# Patient Record
Sex: Female | Born: 1960
Health system: Southern US, Community
[De-identification: ages and names within clinical notes are randomized; demographics above are authoritative.]

## PROBLEM LIST (undated history)

## (undated) DIAGNOSIS — E079 Disorder of thyroid, unspecified: Secondary | ICD-10-CM

## (undated) DIAGNOSIS — I10 Essential (primary) hypertension: Secondary | ICD-10-CM

## (undated) DIAGNOSIS — T7840XA Allergy, unspecified, initial encounter: Secondary | ICD-10-CM

## (undated) DIAGNOSIS — N979 Female infertility, unspecified: Secondary | ICD-10-CM

## (undated) HISTORY — DX: Disorder of thyroid, unspecified: E07.9

## (undated) HISTORY — DX: Allergy, unspecified, initial encounter: T78.40XA

## (undated) HISTORY — DX: Female infertility, unspecified: N97.9

## (undated) HISTORY — DX: Essential (primary) hypertension: I10

## (undated) HISTORY — PX: TUBAL LIGATION: SHX77

---

## 2000-08-17 ENCOUNTER — Other Ambulatory Visit: Admission: RE | Admit: 2000-08-17 | Discharge: 2000-08-17 | Payer: Self-pay | Admitting: Obstetrics and Gynecology

## 2003-07-13 ENCOUNTER — Inpatient Hospital Stay (HOSPITAL_COMMUNITY): Admission: AD | Admit: 2003-07-13 | Discharge: 2003-07-13 | Payer: Self-pay | Admitting: Obstetrics & Gynecology

## 2004-11-19 ENCOUNTER — Encounter: Admission: RE | Admit: 2004-11-19 | Discharge: 2004-11-19 | Payer: Self-pay | Admitting: Allergy and Immunology

## 2009-06-21 ENCOUNTER — Ambulatory Visit: Payer: Self-pay | Admitting: Internal Medicine

## 2009-06-21 DIAGNOSIS — J309 Allergic rhinitis, unspecified: Secondary | ICD-10-CM

## 2009-06-21 DIAGNOSIS — E669 Obesity, unspecified: Secondary | ICD-10-CM

## 2009-06-21 DIAGNOSIS — E039 Hypothyroidism, unspecified: Secondary | ICD-10-CM | POA: Insufficient documentation

## 2009-07-20 ENCOUNTER — Ambulatory Visit: Payer: Self-pay | Admitting: Internal Medicine

## 2009-07-20 DIAGNOSIS — J018 Other acute sinusitis: Secondary | ICD-10-CM | POA: Insufficient documentation

## 2009-07-20 DIAGNOSIS — R0789 Other chest pain: Secondary | ICD-10-CM

## 2009-12-31 ENCOUNTER — Ambulatory Visit: Payer: Self-pay | Admitting: Internal Medicine

## 2009-12-31 LAB — CONVERTED CEMR LAB
Alkaline Phosphatase: 75 units/L (ref 39–117)
Bilirubin, Direct: 0.1 mg/dL (ref 0.0–0.3)
Total Bilirubin: 0.4 mg/dL (ref 0.3–1.2)

## 2010-01-01 ENCOUNTER — Encounter: Payer: Self-pay | Admitting: Internal Medicine

## 2010-03-08 ENCOUNTER — Ambulatory Visit: Payer: Self-pay | Admitting: Internal Medicine

## 2010-03-08 DIAGNOSIS — J069 Acute upper respiratory infection, unspecified: Secondary | ICD-10-CM | POA: Insufficient documentation

## 2010-05-07 NOTE — Assessment & Plan Note (Signed)
Summary: 6 MONTH FOLLOW UP/MHF rsch per pt/dt   Vital Signs:  Patient profile:   50 year old female Height:      67 inches Weight:      208.50 pounds BMI:     32.77 O2 Sat:      100 % on Room air Temp:     98.0 degrees F oral Pulse rate:   76 / minute Pulse rhythm:   regular Resp:     18 per minute BP sitting:   108 / 70  (right arm) Cuff size:   regular  Vitals Entered By: Glendell Docker CMA (December 31, 2009 2:09 PM)  O2 Flow:  Room air CC: 6 Month Follow up Is Patient Diabetic? No Pain Assessment Patient in pain? no      Comments no concerns   Primary Care Provider:  Dondra Spry DO  CC:  6 Month Follow up.  History of Present Illness: 50 y/o female with hx of hypothyroidism for f/u  on vacation recently  tries to eat healthy slight wt loss since prev OV  Current Diet: Breakfast: cereal - fiberone,  wt control oatmeal Lunch:  lean cuisine, burger,  [place] food,  pizza DInner:  some type of meat Snacks: occ cookies, and cakes Beverage: mostly water,  occ diet soda   Preventive Screening-Counseling & Management  Alcohol-Tobacco     Smoking Status: quit  Allergies: 1)  ! Pcn 2)  ! Novocain  Past History:  Past Medical History: Hypothyroidism - 2000 Allergic rhinitis-  Dr. 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   Lungs:  normal respiratory effort and normal breath sounds.   Heart:  normal rate, regular  rhythm, and no gallop.   Extremities:  No lower extremity edema   Impression & Recommendations:  Problem # 1:  HYPOTHYROIDISM (ICD-244.9) monitor TFTs  Her updated medication list for this problem includes:    Levothyroxine Sodium 100 Mcg Tabs (Levothyroxine sodium) .Marland Kitchen... Take 1 tablet by mouth once a day  Orders: T-TSH ([address])  Problem # 2:  OBESITY (ICD-278.00) Pt counseled on diet and exercise.  Orders: T-Hepatic Function [id number]) T-Lipid Profile [id number]) T- Hemoglobin A1C ([id number])  Complete Medication List: 1)  Levothyroxine Sodium 100 Mcg Tabs (Levothyroxine sodium) .... Take 1 tablet by mouth once a day 2)  Clarinex 5 Mg Tabs (Desloratadine) .... Take 1 tablet by mouth once a day 3)  Singulair 10 Mg Tabs (Montelukast sodium) .... Take 1 tablet by mouth once a day 4)  Astelin 137 Mcg/spray Soln (Azelastine hcl) .... 2 sprays each nostril two times a da as needed 5)  Ventolin Hfa 108 (90 Base) Mcg/act Aers (Albuterol sulfate) .... 2 sprays by mouth once daily as needed 6)  Vitamin D (ergocalciferol) 50000 Unit Caps (Ergocalciferol) .... One tablet  by mouth once a month  Patient Instructions: 1)  Please schedule a follow-up appointment in 6 months.  Contraindications/Deferment of Procedures/Staging:    Test/Procedure: FLU VAX    Reason for deferment: patient declined   Current Allergies (reviewed today): ! PCN ! NOVOCAIN   Immunization History:  Influenza Immunization History:    Influenza:  declined (12/31/2009)

## 2010-05-07 NOTE — Assessment & Plan Note (Signed)
Summary: chest congestion/hea   Vital Signs:  Patient profile:   50 year old female Height:      67 inches Weight:      212 pounds BMI:     33.32 O2 Sat:      100 % on Room air Temp:     97.7 degrees F oral Pulse rate:   80 / minute Pulse rhythm:   regular Resp:     16 per minute BP sitting:   124 / 70  (right arm) Cuff size:   large  Vitals Entered By: Glendell Docker CMA (July 20, 2009 9:02 AM)  O2 Flow:  Room air CC: Rm 3- Chest Congestion   Primary Care Provider:  DThomos Lemons DO  CC:  Rm 3- Chest Congestion.  History of Present Illness: 50 y/o AA female with PMhx of allergic rhinitis c/o chest tightness increased nasal congestion x several weeks chest feels tight x 24 hrs coughing - non productive no fever she gets occ heartburn her chesk symptoms worse at night, improved with using albuterol  she denies exertion chest thightness  EKG  Procedure date:  07/20/2009  Findings:      Normal sinus rhythm with rate of:  77 bpm  Allergies: 1)  ! Pcn 2)  ! Novocain  Past History:  Past Medical History: Hypothyroidism - 2000 Allergic rhinitis-  Dr. 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   Ears:  R ear normal and L ear normal.   Mouth:  pharynx pink and moist.   Neck:  No deformities, masses, or tenderness noted. Lungs:  normal respiratory effort and normal breath sounds.   Heart:  normal  rate, regular rhythm, no murmur, and no gallop.   Abdomen:  soft, non-tender, and normal bowel sounds.   Extremities:  No lower extremity edema  Neurologic:  cranial nerves II-XII intact and gait normal.     Impression & Recommendations:  Problem # 1:  CHEST DISCOMFORT (YQM-578.46) 50 y/o with hx of allergies c/o chest tightness.   EKG normal I suspect GERD is potential trigger.  start PPI  Problem # 2:  RHINOSINUSITIS, ACUTE (ICD-461.8)  Her updated medication list for this problem includes:    Astelin 137 Mcg/spray Soln (Azelastine hcl) .Marland Kitchen... 2 sprays each nostril two times a da as needed    Azithromycin 250 Mg Tabs (Azithromycin) .Marland Kitchen... 2 tabs by mouth on day one, then one by mouth once daily x 4 days  Instructed on treatment. Call if symptoms persist or worsen.   Problem # 3:  ALLERGIC RHINITIS (ICD-477.9)  Her updated medication list for this problem includes:    Clarinex 5 Mg Tabs (Desloratadine) .Marland Kitchen... Take 1 tablet by mouth once a day    Astelin 137 Mcg/spray Soln (Azelastine hcl) .Marland Kitchen... 2 sprays each nostril two times a da as needed  Continue meds  Complete  Medication List: 1)  Levothyroxine Sodium 100 Mcg Tabs (Levothyroxine sodium) .... Take 1 tablet by mouth once a day 2)  Clarinex 5 Mg Tabs (Desloratadine) .... Take 1 tablet by mouth once a day 3)  Singulair 10 Mg Tabs (Montelukast sodium) .... Take 1 tablet by mouth once a day 4)  Astelin 137 Mcg/spray Soln (Azelastine hcl) .... 2 sprays each nostril two times a da as needed 5)  Ventolin Hfa 108 (90 Base) Mcg/act Aers (Albuterol sulfate) .... 2 sprays by mouth once daily as needed 6)  Vitamin D (ergocalciferol) 50000 Unit Caps (Ergocalciferol) .... One tablet  by mouth once a month 7)  Omeprazole 20 Mg Cpdr (Omeprazole) .... One by mouth once daily 15 mins before  breakfast 8)  Azithromycin 250 Mg Tabs (Azithromycin) .... 2 tabs by mouth on day one, then one by mouth once daily x 4 days  Patient Instructions: 1)   Call our office if your symptoms do not  improve or gets worse. 2)  Please schedule a follow-up appointment in 1 month. Prescriptions: AZITHROMYCIN 250 MG TABS (AZITHROMYCIN) 2 tabs by mouth on day one, then one by mouth once daily x 4 days  #6 x 0   Entered and Authorized by:   D. Thomos Lemons DO   Signed by:   D. Thomos Lemons DO on 07/20/2009   Method used:   Electronically to        [id number]* (retail)       [address] [place], [place]  [address]       Ph: [phone] or [phone]       Fax: [phone]   RxID:   [id number] OMEPRAZOLE 20 MG CPDR (OMEPRAZOLE) one by mouth once daily 15 mins before  breakfast  #30 x 1   Entered and Authorized by:   D. Thomos Lemons DO   Signed by:   D. Thomos Lemons DO on 07/20/2009   Method used:   Electronically to        [facility] [phone]* (retail)       [address] [place], [place]  [address]       Ph: [phone] or [phone]       Fax: [phone]   RxID:   [id number]   Current Allergies (reviewed today): ! PCN ! NOVOCAIN    Appended Document: Orders Update    Clinical Lists Changes  Orders: Added new Service order of EKG w/ Interpretation (93000) - Signed

## 2010-05-07 NOTE — Assessment & Plan Note (Signed)
Summary: new to be est- jr   Vital Signs:  Patient profile:   50 year old female Height:      67 inches Weight:      210.75 pounds BMI:     33.13 O2 Sat:      100 % on Room air Temp:     98.3 degrees F oral Pulse rate:   84 / minute Pulse rhythm:   regular Resp:     16 per minute BP sitting:   104 / 82  (right arm) Cuff size:   large  Vitals Entered By: Glendell Docker CMA (June 21, 2009 1:34 PM)  O2 Flow:  Room air  Primary Care Provider:  D. Thomos Lemons DO   History of Present Illness: 50 y/o AA female with hx of hypothyroidims to establish. hypothyroid since 2000  last TSH normal - 04/2009 no wt gain.   struggles with obesity tried wt watchers in the past but couldn't stay on diet  she denies hx of htn, hyperlipidemia no regular exercise  Preventive Screening-Counseling & Management  Alcohol-Tobacco     Alcohol drinks/day: 0     Smoking Status: quit     Packs/Day: <0.25     Year Started: 1985     Year Quit: 1992  Caffeine-Diet-Exercise     Caffeine use/day: 3 beverages daily     Does Patient Exercise: no  Allergies (verified): 1)  ! Pcn 2)  ! Novocain  Past History:  Past Medical History: Hypothyroidism - 2000 Allergic rhinitis-  Dr. 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    Physical Exam  General:  alert, well-developed, well-nourished, and overweight-appearing.     Impression & Recommendations:  Problem # 1:  HYPOTHYROIDISM (ICD-244.9) Presumed autoimmune hypothyroidism.  TSH q 6 months  Her updated medication list for this problem includes:    Levothyroxine Sodium 100 Mcg Tabs (Levothyroxine sodium) .Marland Kitchen... Take 1 tablet by mouth once a day  Problem # 2:  OBESITY (ICD-278.00) Pt counseled on diet and exercise.  Pt defers joining wt watchers until she finished evening classes  Problem # 3:  ALLERGIC RHINITIS (ICD-477.9) Pt followed by allergist.  she has been skin tested with mulitiple allergies.  continue maintenance meds  Her updated medication list for this problem includes:    Clarinex 5 Mg Tabs (Desloratadine) .Marland Kitchen... Take 1 tablet by mouth once a day    Astelin 137 Mcg/spray Soln (Azelastine hcl) .Marland Kitchen... 2 sprays each nostril two times a da as needed  Complete Medication List: 1)  Levothyroxine Sodium 100 Mcg Tabs (Levothyroxine sodium) .... Take 1 tablet by mouth once a day 2)  Clarinex 5 Mg Tabs (Desloratadine) .... Take 1 tablet by mouth once a day 3)  Singulair 10 Mg Tabs (Montelukast sodium) .... Take 1 tablet  by mouth once a day 4)  Astelin 137 Mcg/spray Soln (Azelastine hcl) .... 2 sprays each nostril two times a da as needed 5)  Ventolin Hfa 108 (90 Base) Mcg/act Aers (Albuterol sulfate) .... 2 sprays by mouth once daily as needed 6)  Vitamin D (ergocalciferol) 50000 Unit Caps (Ergocalciferol) .... One tablet  by mouth once a month  Patient Instructions: 1)  Please schedule a follow-up appointment in 6 months. 2)  BMP prior to visit, ICD-9:   244.90 3)  AST, ALT Lipid Panel prior to visit, ICD-9:  244.90 4)  TSH prior to visit, ICD-9: 244.90 5)  HbgA1C prior to visit, ICD-9: 790.29 6)  Please return for lab work one (1) week before your next appointment.    Preventive  Care Screening  Pap Smear:    Date:  04/16/2009    Results:  normal   Mammogram:    Date:  03/26/2009    Results:  normal   Bone Density:    Date:  06/22/2006    Results:  normal std dev    Immunization History:  Influenza Immunization History:    Influenza:  declined (06/14/2009)   Contraindications/Deferment of Procedures/Staging:    Test/Procedure: FLU VAX    Reason for deferment: patient declined   Current Allergies (reviewed today): ! PCN ! NOVOCAIN

## 2010-05-07 NOTE — Assessment & Plan Note (Signed)
Summary: congestion/mhf   Vital Signs:  Patient profile:   50 year old female Height:      67 inches Weight:      210 pounds BMI:     33.01 O2 Sat:      99 % on Room air Temp:     98.3 degrees F oral Pulse rate:   98 / minute Resp:     22 per minute BP sitting:   140 / 80  (right arm) Cuff size:   large  Vitals Entered By: Glendell Docker CMA (March 08, 2010 9:05 AM)  O2 Flow:  Room air CC: Head congestion Is Patient Diabetic? No Pain Assessment Patient in pain? no      Comments head congestion, nasal drainage green/brown in color, non productive-productive cough, felt feverish, but has not taken temperature, Onset Sunday, taken Mucinex and Tylenol with some relief   Primary Care Provider:  Dondra Spry DO  CC:  Head congestion.  History of Present Illness: cough x several days productive (gray and brown) mild body aches headaches upper sinus pressure no sign sob,   no fever  Preventive Screening-Counseling & Management  Alcohol-Tobacco     Smoking Status: quit  Allergies: 1)  ! Pcn 2)  ! Novocain  Past History:  Past Medical History: Hypothyroidism - 2000 Allergic rhinitis-  Dr. Willa Rough - allergist Infertility         Past Surgical History: tubes removed 2002      Physical Exam  General:  alert, well-developed, and well-nourished.   Ears:  R ear normal and L ear normal.   Mouth:  pharyngeal erythema.   Lungs:  normal respiratory effort, normal breath sounds, no crackles, and no wheezes.   Heart:  normal rate, regular rhythm, no murmur, and no gallop.     Impression & Recommendations:  Problem # 1:  URI (ICD-465.9)  Her updated medication list for this problem includes:    Clarinex 5 Mg Tabs (Desloratadine) .Marland Kitchen... Take 1 tablet by mouth once a day    Hydrocodone-homatropine 5-1.5 Mg/44ml Syrp (Hydrocodone-homatropine) .Marland KitchenMarland KitchenMarland KitchenMarland Kitchen 5 ml by mouth two times a day as needed for cough  Instructed on symptomatic treatment. Call if symptoms persist or  worsen.  conservative measures first.  We discussed symptoms of secondary bacterial infection.    Complete Medication List: 1)  Levothyroxine Sodium 100 Mcg Tabs (Levothyroxine sodium) .... Take 1 tablet by mouth once a day 2)  Clarinex 5 Mg Tabs (Desloratadine) .... Take 1 tablet by mouth once a day 3)  Singulair 10 Mg Tabs (Montelukast sodium) .... Take 1 tablet by mouth once a day 4)  Astelin 137 Mcg/spray Soln (Azelastine hcl) .... 2 sprays each nostril two times a da as needed 5)  Ventolin Hfa 108 (90 Base) Mcg/act Aers (Albuterol sulfate) .... 2 sprays by mouth once daily as needed 6)  Vitamin D (ergocalciferol) 50000 Unit Caps (Ergocalciferol) .... One tablet  by mouth once a month 7)  Hydrocodone-homatropine 5-1.5 Mg/4ml Syrp (Hydrocodone-homatropine) .... 5 ml by mouth two times a day as needed for cough 8)  Azithromycin 250 Mg Tabs (Azithromycin) .... 2 tabs by mouth once daily x 3 days  Patient Instructions: 1)  Use saline nose spray 2)  Gargle with warm salt water two times a day  3)  Call our office if your symptoms do not  improve or gets worse. Prescriptions: AZITHROMYCIN 250 MG TABS (AZITHROMYCIN) 2 tabs by mouth once daily x 3 days  #6 x 0  Entered and Authorized by:   D. Thomos Lemons DO   Signed by:   D. Thomos Lemons DO on 03/08/2010   Method used:   Print then Give to Patient   RxID:   [id number] HYDROCODONE-HOMATROPINE 5-1.5 MG/5ML SYRP (HYDROCODONE-HOMATROPINE) 5 ml by mouth two times a day as needed for cough  #90 ml x 0   Entered and Authorized by:   D. Thomos Lemons DO   Signed by:   D. Thomos Lemons DO on 03/08/2010   Method used:   Print then Give to Patient   RxID:   [id number]    Orders Added: 1)  Est. Patient Level III [[address]]    Current Allergies (reviewed today): ! PCN ! NOVOCAIN

## 2010-05-07 NOTE — Letter (Signed)
   [place] [facility] [address] [place], [place]  [address]  [place] Phone: [phone]      January 01, 2010   [facility] [address] [place], [place] [address]  RE:  LAB RESULTS  Dear  Ms. Kovich,  The following is an interpretation of your most recent lab tests.  Please take note of any instructions provided or changes to medications that have resulted from your lab work.  LIVER FUNCTION TESTS:  Good - no changes needed  LIPID PANEL:  Good - no changes needed Triglyceride: 149   Cholesterol: 167   LDL: 97   HDL: 40   Chol/HDL%:  4.2 Ratio  THYROID STUDIES:  Thyroid studies normal TSH: 2.670           Sincerely Yours,    Dr. Thomos Lemons  Appended Document:  mailed

## 2010-06-07 ENCOUNTER — Ambulatory Visit: Payer: Self-pay | Admitting: Internal Medicine

## 2010-09-20 ENCOUNTER — Encounter: Payer: Self-pay | Admitting: Family

## 2010-09-23 ENCOUNTER — Ambulatory Visit (INDEPENDENT_AMBULATORY_CARE_PROVIDER_SITE_OTHER): Payer: PRIVATE HEALTH INSURANCE | Admitting: Family

## 2010-09-23 ENCOUNTER — Encounter: Payer: Self-pay | Admitting: Family

## 2010-09-23 DIAGNOSIS — R0989 Other specified symptoms and signs involving the circulatory and respiratory systems: Secondary | ICD-10-CM

## 2010-09-23 DIAGNOSIS — E039 Hypothyroidism, unspecified: Secondary | ICD-10-CM

## 2010-09-23 DIAGNOSIS — E559 Vitamin D deficiency, unspecified: Secondary | ICD-10-CM

## 2010-09-23 LAB — TSH: TSH: 1.261 u[IU]/mL (ref 0.350–4.500)

## 2010-09-23 NOTE — Assessment & Plan Note (Signed)
Check TSH, continue synthroid. 

## 2010-09-23 NOTE — Assessment & Plan Note (Signed)
I suspect that this is either due to allergies or a resolving viral illness. She was instructed to call if her symptoms worsen or if they do not improve.

## 2010-09-23 NOTE — Patient Instructions (Signed)
Please follow up in 6 months, sooner if problems or concerns. Complete your lab work on the first floor.

## 2010-09-23 NOTE — Progress Notes (Signed)
Subjective:    Patient ID: Brianna [name], female    DOB: [birth year], 50 y.o.   MRN: [id number]  HPI  Hypothyroid- denies fatigue or unexpected weight changes.  + med compliance.   Throat congestion-  Reports that is worsened about 1 week ago, + HA had mild fever last week- subjective, + cough.  Non-productive.  Feels like overall this is improving.    Review of Systems See HPI  Past Medical History  Diagnosis Date  . Thyroid disease     hypothyroidism  . Infertility, female   . Allergy     allergic rhinitis-- Dr Willa Rough, allergist    History   Social History  . Marital Status: Single    Spouse Name: N/A    Number of Children: N/A  . Years of Education: N/A   Occupational History  . Not on file.   Social History Main Topics  . Smoking status: Former Smoker -- 5 years    Types: Cigarettes    Quit date: 04/07/1990  . Smokeless tobacco: Not on file   Comment: light smoker  . Alcohol Use: No  . Drug Use: Not on file  . Sexually Active: Not on file   Other Topics Concern  . 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    No past surgical history on file.  Family History  Problem Relation Age of Onset  . Hypertension Mother   . Hyperlipidemia Father   . Hypertension Father   . Stroke Father   . Arthritis Other     Allergies  Allergen Reactions  . Penicillins     REACTION: Rash  . Procaine Hcl     REACTION: Rash    Current Outpatient Prescriptions on File Prior to Visit  Medication Sig Dispense Refill  . albuterol (VENTOLIN HFA) 108 (90 BASE) MCG/ACT inhaler Inhale 2 puffs into the lungs daily as needed.        Marland Kitchen azelastine (ASTELIN) 137 MCG/SPRAY nasal spray Place 2 sprays into both nostrils 2 (two) times daily. Use in each nostril as directed       . desloratadine (CLARINEX) 5 MG tablet Take 5 mg by mouth daily.        . ergocalciferol (VITAMIN D2) 50000 UNITS capsule Take 50,000 Units by  mouth every 30 (thirty) days.        Marland Kitchen levothyroxine (SYNTHROID, LEVOTHROID) 100 MCG tablet Take 100 mcg by mouth daily.        . montelukast (SINGULAIR) 10 MG tablet Take 10 mg by mouth daily.          BP 118/76  Pulse 84  Temp(Src) 98.6 F (37 C) (Oral)  Resp 16  Ht 5\' 7"  (1.702 m)  Wt 215 lb 1.3 oz (97.56 kg)  BMI 33.69 kg/m2       Objective:   Physical Exam  Constitutional: She appears well-developed and well-nourished.  HENT:  Head: Normocephalic and atraumatic.  Neck: Normal range of motion. Neck supple. No tracheal deviation present. No thyromegaly present.  Cardiovascular: Normal rate and regular rhythm.   Pulmonary/Chest: Effort normal and breath sounds normal. No respiratory distress. She has no wheezes. She has no rales. She exhibits no tenderness.  Lymphadenopathy:    She has no cervical adenopathy.  Skin: Skin is warm and dry.  Psychiatric: She has a normal mood and affect. Her behavior is normal. Judgment and thought content normal.  Assessment & Plan:

## 2010-09-24 ENCOUNTER — Encounter: Payer: Self-pay | Admitting: Family

## 2010-09-24 LAB — VITAMIN D 25 HYDROXY (VIT D DEFICIENCY, FRACTURES): Vit D, 25-Hydroxy: 33 ng/mL (ref 30–89)

## 2011-03-28 ENCOUNTER — Ambulatory Visit (INDEPENDENT_AMBULATORY_CARE_PROVIDER_SITE_OTHER): Payer: PRIVATE HEALTH INSURANCE | Admitting: Internal Medicine

## 2011-03-28 ENCOUNTER — Encounter: Payer: Self-pay | Admitting: Internal Medicine

## 2011-03-28 ENCOUNTER — Ambulatory Visit: Payer: PRIVATE HEALTH INSURANCE | Admitting: Internal Medicine

## 2011-03-28 VITALS — BP 116/72 | HR 76 | Temp 98.2°F | Ht 67.5 in | Wt 186.0 lb

## 2011-03-28 DIAGNOSIS — E669 Obesity, unspecified: Secondary | ICD-10-CM

## 2011-03-28 DIAGNOSIS — E039 Hypothyroidism, unspecified: Secondary | ICD-10-CM

## 2011-03-28 DIAGNOSIS — J309 Allergic rhinitis, unspecified: Secondary | ICD-10-CM

## 2011-03-28 MED ORDER — LEVOTHYROXINE SODIUM 100 MCG PO TABS: 100.0000 ug | ORAL_TABLET | Freq: Every day | ORAL | Status: DC

## 2011-03-28 NOTE — Patient Instructions (Signed)
Please complete the following lab tests before your next follow up appointment: TSH - 244.9 BMET, Lipid panel - v70

## 2011-03-28 NOTE — Assessment & Plan Note (Signed)
-  Stable. Continue current medication regimen. 

## 2011-03-28 NOTE — Progress Notes (Signed)
  Subjective:    Patient ID: Brianna [name], female    DOB: 14-Feb-1961, 50 y.o.   MRN: [id number]  HPI  50 year old [place] American female with history of allergic rhinitis and hypothyroidism for routine followup. Since previous visit patient is been able to lose significant amount of weight. She is down to 186 pounds. She has accomplished this by exercising 4 times a week at a "boot camp".  Allergic rhinitis-stable.  She has not used her rescue inhaler for several months.                               Review of Systems Negative for chest pain or unusual shortness of breath  Past Medical History  Diagnosis Date  . Thyroid disease     hypothyroidism  . Infertility, female   . Allergy     allergic rhinitis-- Dr Willa Rough, allergist    History   Social History  . Marital Status: Single    Spouse Name: N/A    Number of Children: N/A  . Years of Education: N/A   Occupational History  . Not on file.   Social History Main Topics  . Smoking status: Former Smoker -- 5 years    Types: Cigarettes    Quit date: 04/07/1990  . Smokeless tobacco: Not on file   Comment: light smoker  . Alcohol Use: No  . Drug Use: Not on file  . Sexually Active: Not on file   Other Topics Concern  . 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    No past surgical history on file.  Family History  Problem Relation Age of Onset  . Hypertension Mother   . Hyperlipidemia Father   . Hypertension Father   . Stroke Father   . Arthritis Other     Allergies  Allergen Reactions  . Penicillins     REACTION: Rash  . Procaine Hcl     REACTION: Rash    Current Outpatient Prescriptions on File Prior to Visit  Medication Sig Dispense Refill  . albuterol (VENTOLIN HFA) 108 (90 BASE) MCG/ACT inhaler Inhale 2 puffs into the lungs daily as needed.        . desloratadine (CLARINEX) 5 MG tablet Take 5 mg by mouth daily.        . ergocalciferol  (VITAMIN D2) 50000 UNITS capsule Take 50,000 Units by mouth every 30 (thirty) days.        . montelukast (SINGULAIR) 10 MG tablet Take 10 mg by mouth daily.          BP 116/72  Pulse 76  Temp(Src) 98.2 F (36.8 C) (Oral)  Ht 5' 7.5" (1.715 m)  Wt 186 lb (84.369 kg)  BMI 28.70 kg/m2       Objective:   Physical Exam  Constitutional: She appears well-developed and well-nourished.  HENT:  Head: Normocephalic and atraumatic.  Neck: Normal range of motion. Neck supple. No thyromegaly present.  Cardiovascular: Normal rate, regular rhythm and normal heart sounds.   Pulmonary/Chest: Effort normal and breath sounds normal. She has no wheezes. She has no rales.      Assessment & Plan:

## 2011-03-28 NOTE — Assessment & Plan Note (Signed)
Stable.  Monitor TSH yearly.

## 2011-03-28 NOTE — Assessment & Plan Note (Signed)
Improved with regular exercise program ("boot camp").   Goal weight loss - additional 20-25 lbs.

## 2011-10-13 ENCOUNTER — Telehealth: Payer: Self-pay | Admitting: Internal Medicine

## 2011-10-13 MED ORDER — LEVOTHYROXINE SODIUM 100 MCG PO TABS: 100.0000 ug | ORAL_TABLET | Freq: Every day | ORAL | Status: DC

## 2011-10-13 NOTE — Telephone Encounter (Signed)
rx sent in electronically 

## 2011-10-13 NOTE — Telephone Encounter (Signed)
Refill- levothyroxine tab. Take one tablet by mouth one time daily. Qty 90 last fill 4.7.13

## 2012-01-07 ENCOUNTER — Other Ambulatory Visit (INDEPENDENT_AMBULATORY_CARE_PROVIDER_SITE_OTHER): Payer: PRIVATE HEALTH INSURANCE

## 2012-01-07 DIAGNOSIS — Z0000 Encounter for general adult medical examination without abnormal findings: Secondary | ICD-10-CM

## 2012-01-07 LAB — POCT URINALYSIS DIPSTICK
Blood, UA: NEGATIVE
Nitrite, UA: NEGATIVE
pH, UA: 7

## 2012-01-07 LAB — CBC WITH DIFFERENTIAL/PLATELET
Basophils Absolute: 0 10*3/uL (ref 0.0–0.1)
Basophils Relative: 0.7 % (ref 0.0–3.0)
Eosinophils Absolute: 0.1 10*3/uL (ref 0.0–0.7)
Eosinophils Relative: 2.4 % (ref 0.0–5.0)
Hemoglobin: 13.3 g/dL (ref 12.0–15.0)
Lymphocytes Relative: 40.9 % (ref 12.0–46.0)
Lymphs Abs: 1.5 10*3/uL (ref 0.7–4.0)
MCHC: 33.4 g/dL (ref 30.0–36.0)
MCV: 94.4 fl (ref 78.0–100.0)
Monocytes Relative: 14.6 % — ABNORMAL HIGH (ref 3.0–12.0)
Neutro Abs: 1.5 10*3/uL (ref 1.4–7.7)
Neutrophils Relative %: 41.4 % — ABNORMAL LOW (ref 43.0–77.0)
Platelets: 275 10*3/uL (ref 150.0–400.0)
RDW: 12.7 % (ref 11.5–14.6)

## 2012-01-07 LAB — LIPID PANEL
HDL: 53.1 mg/dL (ref >39.00)
Total CHOL/HDL Ratio: 4
Triglycerides: 69 mg/dL (ref 0.0–149.0)

## 2012-01-07 LAB — HEPATIC FUNCTION PANEL
ALT: 29 U/L (ref 0–35)
Bilirubin, Direct: 0.1 mg/dL (ref 0.0–0.3)

## 2012-01-07 LAB — BASIC METABOLIC PANEL
CO2: 29 mEq/L (ref 19–32)
Chloride: 105 mEq/L (ref 96–112)
GFR: 110.71 mL/min (ref >60.00)
Glucose, Bld: 86 mg/dL (ref 70–99)

## 2012-01-08 ENCOUNTER — Other Ambulatory Visit: Payer: Self-pay | Admitting: Internal Medicine

## 2012-01-08 ENCOUNTER — Telehealth: Payer: Self-pay | Admitting: Internal Medicine

## 2012-01-08 MED ORDER — LEVOTHYROXINE SODIUM 100 MCG PO TABS: 100.0000 ug | ORAL_TABLET | Freq: Every day | ORAL | Status: DC

## 2012-01-08 NOTE — Telephone Encounter (Signed)
Caller: Brianna [name]/Patient; Patient Name: Brianna [name]; PCP: Artist Pais, Doe-Hyun Molly Maduro) (Adults only); Best Callback Phone Number: [phone]  LMP 12/03/11 Onset 01/08/12 Patient is calling back in regards to a call recieved earlier by Raj Janus.  Patient states that earlier she denied symptoms of a UTI however states that she does have strong smell to urine, some back pain that could be due to menstrual cycle which patient is currently on, and a low grade fever (no thermometer states that she feels like she does) that she thought was from her allergies.  Patient would like to make aware of symptoms in case treatment is recommended.  All emergent symptoms ruled out per Urinary Symptoms protocol with exception of "All other situations". Home care advice given.  OFFICE NOTE: PLEASE CALL PATIENT BACK IF WOULD LIKE FOR PATIENT TO MONITOR UNTIL CYCLE RESOLVES OR IF WOULD LIKE TO START ON TREATMENT.

## 2012-01-09 ENCOUNTER — Other Ambulatory Visit: Payer: Self-pay | Admitting: Family Medicine

## 2012-01-09 MED ORDER — CIPROFLOXACIN HCL 250 MG PO TABS: 250.0000 mg | ORAL_TABLET | Freq: Two times a day (BID) | ORAL | Status: DC

## 2012-01-09 NOTE — Telephone Encounter (Signed)
Pt notified and rx sent to [facility] in [place].  Instructed to call back if sx persist or worsen.

## 2012-01-09 NOTE — Telephone Encounter (Signed)
I suggest cipro 250 mg  # 10 one po bid.  Pt should increase fluids.  Call office if worsening symptoms, esp fever or back pain

## 2012-01-12 ENCOUNTER — Ambulatory Visit (INDEPENDENT_AMBULATORY_CARE_PROVIDER_SITE_OTHER): Payer: PRIVATE HEALTH INSURANCE | Admitting: Internal Medicine

## 2012-01-12 ENCOUNTER — Telehealth: Payer: Self-pay | Admitting: Internal Medicine

## 2012-01-12 ENCOUNTER — Encounter: Payer: Self-pay | Admitting: Internal Medicine

## 2012-01-12 VITALS — BP 114/76 | HR 76 | Temp 97.9°F | Wt 200.0 lb

## 2012-01-12 DIAGNOSIS — M6790 Unspecified disorder of synovium and tendon, unspecified site: Secondary | ICD-10-CM

## 2012-01-12 NOTE — Progress Notes (Signed)
  Subjective:    Patient ID: Brianna [name], female    DOB: 1961-03-16, 51 y.o.   MRN: [id number]  HPI  51 year old African American female complains of muscle tenderness in her right forearm and her lower extremities. Patient states her symptoms started after starting ciprofloxacin on October 3 secondary to possible urinary tract infection. She took her last dose today. She was prescribed ciprofloxacin 250 mg twice daily for 5 days.   Review of Systems No weakness  Past Medical History  Diagnosis Date  . Thyroid disease     hypothyroidism  . Infertility, female   . Allergy     allergic rhinitis-- Dr Willa Rough, allergist    History   Social History  . Marital Status: Single    Spouse Name: N/A    Number of Children: N/A  . Years of Education: N/A   Occupational History  . Not on file.   Social History Main Topics  . Smoking status: Former Smoker -- 5 years    Types: Cigarettes    Quit date: 04/07/1990  . Smokeless tobacco: Not on file   Comment: light smoker  . Alcohol Use: No  . Drug Use: Not on file  . Sexually Active: Not on file   Other Topics Concern  . 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    No past surgical history on file.  Family History  Problem Relation Age of Onset  . Hypertension Mother   . Hyperlipidemia Father   . Hypertension Father   . Stroke Father   . Arthritis Other     Allergies  Allergen Reactions  . Ciprofloxacin Other (See Comments)    Muscle pain  . Penicillins     REACTION: Rash  . Procaine Hcl     REACTION: Rash    Current Outpatient Prescriptions on File Prior to Visit  Medication Sig Dispense Refill  . albuterol (VENTOLIN HFA) 108 (90 BASE) MCG/ACT inhaler Inhale 2 puffs into the lungs daily as needed.        . desloratadine (CLARINEX) 5 MG tablet Take 5 mg by mouth daily.        . ergocalciferol (VITAMIN D2) 50000 UNITS capsule Take 50,000 Units by mouth  every 30 (thirty) days.        Marland Kitchen levothyroxine (SYNTHROID, LEVOTHROID) 100 MCG tablet Take 1 tablet (100 mcg total) by mouth daily.  90 tablet  0  . montelukast (SINGULAIR) 10 MG tablet Take 10 mg by mouth daily.        . Olopatadine HCl 0.6 % SOLN 2 sprays each nostril bid        BP 114/76  Pulse 76  Temp 97.9 F (36.6 C) (Oral)  Wt 200 lb (90.719 kg)       Objective:   Physical Exam  Constitutional: She appears well-developed and well-nourished.  Cardiovascular: Normal rate, regular rhythm and normal heart sounds.   Pulmonary/Chest: Effort normal and breath sounds normal.  Musculoskeletal:       Mild tenderness in her right forearm.            Assessment & Plan:

## 2012-01-12 NOTE — Assessment & Plan Note (Signed)
51 year old African American female complains of sore right forearm and pain in her lower extremities after starting ciprofloxacin. Patient may have fluoroquinolone related tendinopathy. Patient already discontinued ciprofloxacin. Patient advised to avoid strenuous activity and also avoid use of prednisone. Reassess in 4-6 weeks.

## 2012-01-12 NOTE — Telephone Encounter (Signed)
Caller: [name]/Patient; Patient Name: Brianna [name]; PCP: Artist Pais, Doe-Hyun Molly Maduro) (Adults only); Best Callback Phone Number: [phone]; Call regarding Forearm, Calf muscle tenderness with Ankle aching, after starting Cipro for UTI onset 10-6.  Pt started Cipro on 10-4.  UTI symptoms are improving per Pt.  Pt has 5 more doses of Cipro.  Per Health Education Cipro may cause rupture of Achilles tenden or other tendons, pain may develop in ankle, elbow, hand or wrist.  All emergent symptoms ruled out per Allergic Reaction protocol, see in 4 hours, due to new onset of muscle aches and less than 10 days after starting new med.  Appointment scheduled at 1500 on 10-7 with Dr Artist Pais.  Advised Patient to stop Cipro until evaluated by MD.  Patient verbalized understanding.

## 2012-01-26 ENCOUNTER — Ambulatory Visit (INDEPENDENT_AMBULATORY_CARE_PROVIDER_SITE_OTHER): Payer: PRIVATE HEALTH INSURANCE | Admitting: Internal Medicine

## 2012-01-26 ENCOUNTER — Encounter: Payer: Self-pay | Admitting: Internal Medicine

## 2012-01-26 VITALS — BP 108/68 | HR 69 | Temp 98.5°F | Ht 67.5 in | Wt 195.0 lb

## 2012-01-26 DIAGNOSIS — Z0000 Encounter for general adult medical examination without abnormal findings: Secondary | ICD-10-CM

## 2012-01-26 DIAGNOSIS — Z23 Encounter for immunization: Secondary | ICD-10-CM

## 2012-01-26 MED ORDER — LEVOTHYROXINE SODIUM 100 MCG PO TABS: 100.0000 ug | ORAL_TABLET | Freq: Every day | ORAL | Status: DC

## 2012-01-26 NOTE — Assessment & Plan Note (Signed)
Reviewed adult health maintenance protocols.  Patient updated with appropriate adult vaccines. Strategies for weight loss reviewed with patient. She'll be due for her colonoscopy next year. Patient scheduled for follow up mammogram.  Patient reports she will follow up with her gynecologist for routine Pap and pelvic.

## 2012-01-26 NOTE — Patient Instructions (Addendum)
Please see handout on low saturated fat diet Limit your caloric intake to 1200 cal per day - goal weight loss of 10 lbs or more before your next office visit Please complete the following lab tests before your next follow up appointment: TSH - 244.9

## 2012-01-26 NOTE — Progress Notes (Signed)
Subjective:    Patient ID: Brianna [name], female    DOB: 1961-03-10, 51 y.o.   MRN: [id number]  HPI  51 year old African American female with history of hypothyroidism for routine physical. Patient reports her previous issues with sore muscles after taking Cipro has completely resolved. Patient has been struggling with obesity for several years. She has tried Weight Watchers in the past with some success but has not been able to maintain weight loss. She does not exercise on a regular basis.  Screening labs reviewed reviewed in detail with patient.  Review of Systems   Constitutional: Negative for activity change, appetite change and unexpected weight change.  Eyes: Negative for visual disturbance.  Respiratory: Negative for cough, chest tightness and shortness of breath.   Cardiovascular: Negative for chest pain.  Genitourinary: Negative for difficulty urinating.  Neurological: Negative for headaches.  Gastrointestinal: Negative for abdominal pain, heartburn melena or hematochezia Psych: Negative for depression or anxiety  Past Medical History  Diagnosis Date  . Thyroid disease     hypothyroidism  . Infertility, female   . Allergy     allergic rhinitis-- Dr Willa Rough, allergist    History   Social History  . Marital Status: Single    Spouse Name: N/A    Number of Children: N/A  . Years of Education: N/A   Occupational History  . Not on file.   Social History Main Topics  . Smoking status: Former Smoker -- 5 years    Types: Cigarettes    Quit date: 04/07/1990  . Smokeless tobacco: Not on file   Comment: light smoker  . Alcohol Use: No  . Drug Use: Not on file  . Sexually Active: Not on file   Other Topics Concern  . 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    No past surgical history on file.  Family History  Problem Relation Age of Onset  . Hypertension Mother   . Hyperlipidemia Father   .  Hypertension Father   . Stroke Father   . Arthritis Other     Allergies  Allergen Reactions  . Ciprofloxacin Other (See Comments)    Muscle pain  . Penicillins     REACTION: Rash  . Procaine Hcl     REACTION: Rash    Current Outpatient Prescriptions on File Prior to Visit  Medication Sig Dispense Refill  . albuterol (VENTOLIN HFA) 108 (90 BASE) MCG/ACT inhaler Inhale 2 puffs into the lungs daily as needed.        . desloratadine (CLARINEX) 5 MG tablet Take 5 mg by mouth daily.        . ergocalciferol (VITAMIN D2) 50000 UNITS capsule Take 50,000 Units by mouth every 30 (thirty) days.        . montelukast (SINGULAIR) 10 MG tablet Take 10 mg by mouth daily.        . Olopatadine HCl 0.6 % SOLN 2 sprays each nostril bid      . DISCONTD: levothyroxine (SYNTHROID, LEVOTHROID) 100 MCG tablet Take 1 tablet (100 mcg total) by mouth daily.  90 tablet  0    BP 108/68  Pulse 69  Temp 98.5 F (36.9 C) (Oral)  Ht 5' 7.5" (1.715 m)  Wt 195 lb (88.451 kg)  BMI 30.09 kg/m2  SpO2 97%           Objective:   Physical Exam  Constitutional: She is oriented to person,  place, and time. She appears well-developed and well-nourished. No distress.  HENT:  Head: Normocephalic and atraumatic.  Right Ear: External ear normal.  Left Ear: External ear normal.  Mouth/Throat: Oropharynx is clear and moist.  Eyes: EOM are normal. Pupils are equal, round, and reactive to light.  Neck: Normal range of motion. Neck supple.       No carotid bruit  Cardiovascular: Normal rate, regular rhythm and normal heart sounds.   No murmur heard. Pulmonary/Chest: Effort normal and breath sounds normal. She has no wheezes.  Abdominal: Soft. Bowel sounds are normal. There is no tenderness.  Musculoskeletal: She exhibits no edema.  Neurological: She is alert and oriented to person, place, and time. No cranial nerve deficit.  Skin: Skin is warm and dry.  Psychiatric: She has a normal mood and affect. Her behavior is  normal.          Assessment & Plan:

## 2012-02-05 ENCOUNTER — Ambulatory Visit (INDEPENDENT_AMBULATORY_CARE_PROVIDER_SITE_OTHER): Payer: PRIVATE HEALTH INSURANCE | Admitting: Internal Medicine

## 2012-02-05 ENCOUNTER — Encounter: Payer: Self-pay | Admitting: Internal Medicine

## 2012-02-05 VITALS — BP 112/70 | Temp 98.0°F | Wt 199.0 lb

## 2012-02-05 DIAGNOSIS — J069 Acute upper respiratory infection, unspecified: Secondary | ICD-10-CM

## 2012-02-05 NOTE — Progress Notes (Signed)
  Subjective:    Patient ID: Brianna [name], female    DOB: 1960/08/13, 51 y.o.   MRN: [id number]  HPI  51 year old African American female with history of hypothyroidism complains of upper respiratory congestion and cough for 2-3 days. Her symptoms started early this week. She had mild chills and Tuesday night and vomited on Wednesday morning. GI symptoms have resolved. She has mild shortness of breath. No persistent fever. She does have some green mucus when she blows her nose.   Review of Systems See HPI  Past Medical History  Diagnosis Date  . Thyroid disease     hypothyroidism  . Infertility, female   . Allergy     allergic rhinitis-- Dr Willa Rough, allergist    History   Social History  . Marital Status: Single    Spouse Name: N/A    Number of Children: N/A  . Years of Education: N/A   Occupational History  . Not on file.   Social History Main Topics  . Smoking status: Former Smoker -- 5 years    Types: Cigarettes    Quit date: 04/07/1990  . Smokeless tobacco: Not on file   Comment: light smoker  . Alcohol Use: No  . Drug Use: Not on file  . Sexually Active: Not on file   Other Topics Concern  . 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    No past surgical history on file.  Family History  Problem Relation Age of Onset  . Hypertension Mother   . Hyperlipidemia Father   . Hypertension Father   . Stroke Father   . Arthritis Other     Allergies  Allergen Reactions  . Ciprofloxacin Other (See Comments)    Muscle pain  . Penicillins     REACTION: Rash  . Procaine Hcl     REACTION: Rash    Current Outpatient Prescriptions on File Prior to Visit  Medication Sig Dispense Refill  . albuterol (VENTOLIN HFA) 108 (90 BASE) MCG/ACT inhaler Inhale 2 puffs into the lungs daily as needed.        . desloratadine (CLARINEX) 5 MG tablet Take 5 mg by mouth daily.        . ergocalciferol (VITAMIN D2) 50000  UNITS capsule Take 50,000 Units by mouth every 30 (thirty) days.        Marland Kitchen levothyroxine (SYNTHROID, LEVOTHROID) 100 MCG tablet Take 1 tablet (100 mcg total) by mouth daily.  90 tablet  3  . montelukast (SINGULAIR) 10 MG tablet Take 10 mg by mouth daily.        . Olopatadine HCl 0.6 % SOLN 2 sprays each nostril bid        BP 112/70  Temp 98 F (36.7 C) (Oral)  Wt 199 lb (90.266 kg)       Objective:   Physical Exam  Constitutional: She appears well-developed and well-nourished.  HENT:  Head: Normocephalic.  Right Ear: External ear normal.  Left Ear: External ear normal.  Mouth/Throat: No oropharyngeal exudate.       Oropharyngeal erythema  Cardiovascular: Normal rate, regular rhythm and normal heart sounds.   Pulmonary/Chest: Effort normal and breath sounds normal. She has no wheezes.          Assessment & Plan:

## 2012-02-05 NOTE — Assessment & Plan Note (Signed)
51 year old [place] American female with signs and symptoms of viral URI. I suggest symptomatic treatment. Patient advised to call office if symptoms persist or worsen.

## 2012-02-05 NOTE — Patient Instructions (Signed)
Use Mucinex OTC every 12 hrs as needed Drink plenty of fluids and use intranasal saline spray 3-4 times per day Please call our office if your symptoms do not improve or gets worse.

## 2012-04-29 ENCOUNTER — Encounter: Payer: Self-pay | Admitting: Family Medicine

## 2012-04-29 ENCOUNTER — Ambulatory Visit (INDEPENDENT_AMBULATORY_CARE_PROVIDER_SITE_OTHER): Payer: PRIVATE HEALTH INSURANCE | Admitting: Family Medicine

## 2012-04-29 VITALS — BP 130/72 | HR 78 | Temp 98.0°F | Wt 216.0 lb

## 2012-04-29 DIAGNOSIS — R071 Chest pain on breathing: Secondary | ICD-10-CM

## 2012-04-29 DIAGNOSIS — R0789 Other chest pain: Secondary | ICD-10-CM

## 2012-04-29 DIAGNOSIS — J309 Allergic rhinitis, unspecified: Secondary | ICD-10-CM

## 2012-04-29 DIAGNOSIS — J40 Bronchitis, not specified as acute or chronic: Secondary | ICD-10-CM

## 2012-04-29 MED ORDER — GUAIFENESIN-CODEINE 100-10 MG/5ML PO SYRP: 5.0000 mL | ORAL_SOLUTION | Freq: Three times a day (TID) | ORAL | Status: DC | PRN

## 2012-04-29 NOTE — Progress Notes (Signed)
Chief Complaint  Patient presents with  . Cough  . Chest congestion    x 3-4 days. Been taking robitussin and aleve and using inhaler    HPI:  Acute visit for congestion and cough -started: about 1 week ago -symptoms:nasal congestion - worse today, cough,mild SOB today, chest tightness and soreness in chest with with certain movements -denies:fever, NVD, tooth pain, body aches -has tried: clarinex, singulair, albuterol as needed, and nasal spray -sick contacts:  -Hx of: allergies tx by Dr. Willa Rough - told by Dr. Willa Rough she has asthma as well, has has SOB, coughing in the past and uses albuterol for this from time to time. Used this last night for symptoms. Reports has never needed prednisone for breathing problems and has never been hospitalized for breathing problems.  ROS: See pertinent positives and negatives per HPI.  Past Medical History  Diagnosis Date  . Thyroid disease     hypothyroidism  . Infertility, female   . Allergy     allergic rhinitis-- Dr Willa Rough, allergist    Family History  Problem Relation Age of Onset  . Hypertension Mother   . Hyperlipidemia Father   . Hypertension Father   . Stroke Father   . Arthritis Other     History   Social History  . Marital Status: Single    Spouse Name: N/A    Number of Children: N/A  . Years of Education: N/A   Social History Main Topics  . Smoking status: Former Smoker -- 5 years    Types: Cigarettes    Quit date: 04/07/1990  . Smokeless tobacco: None     Comment: light smoker  . Alcohol Use: No  . Drug Use: None  . Sexually Active: None   Other Topics Concern  . 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    Current outpatient prescriptions:albuterol (VENTOLIN HFA) 108 (90 BASE) MCG/ACT inhaler, Inhale 2 puffs into the lungs daily as needed.  , Disp: , Rfl: ;  desloratadine (CLARINEX) 5 MG tablet, Take 5 mg by mouth daily.  , Disp: , Rfl: ;  ergocalciferol  (VITAMIN D2) 50000 UNITS capsule, Take 50,000 Units by mouth every 30 (thirty) days.  , Disp: , Rfl:  levothyroxine (SYNTHROID, LEVOTHROID) 100 MCG tablet, Take 1 tablet (100 mcg total) by mouth daily., Disp: 90 tablet, Rfl: 3;  montelukast (SINGULAIR) 10 MG tablet, Take 10 mg by mouth daily.  , Disp: , Rfl: ;  Olopatadine HCl 0.6 % SOLN, 2 sprays each nostril bid, Disp: , Rfl: ;  guaiFENesin-codeine (ROBITUSSIN AC) 100-10 MG/5ML syrup, Take 5 mLs by mouth 3 (three) times daily as needed for cough., Disp: 120 mL, Rfl: 0  EXAM:  Filed Vitals:   04/29/12 1600  BP: 130/72  Pulse: 78  Temp: 98 F (36.7 C)   O2 98% There is no height on file to calculate BMI.  GENERAL: vitals reviewed and listed above, alert, oriented, appears well hydrated and in no acute distress  HEENT: atraumatic, conjunttiva clear, no obvious abnormalities on inspection of external nose and ears, normal appearance of ear canals and TMs, clear nasal congestion, mild post oropharyngeal erythema with PND, no tonsillar edema or exudate, no sinus TTP  NECK: no obvious masses on inspection  LUNGS: clear to auscultation bilaterally, no wheezes, rales or rhonchi, good air movement  CV: HRRR, no peripheral edema  MS: moves all extremities without noticeable abnormality  PSYCH: pleasant and  cooperative, no obvious depression or anxiety  ASSESSMENT AND PLAN:  Discussed the following assessment and plan:  1. Bronchitis  guaiFENesin-codeine (ROBITUSSIN AC) 100-10 MG/5ML syrup  2. Chest wall pain    3. ALLERGIC RHINITIS     -likely viral infection, ? Asthma, pt refuses oral or IM steroids - says makes her sick. Has QVAR at home given in the past for similar symptoms. Will start QVAR and albuterol prn with supportive care. Nothing to suggest SBI or need for abx. Lung exam normal, normal O2 sats, no signs of resp distress, copious amounts of clear nasal congesstion. -Patient advised to return or notify a doctor immediately if  symptoms worsen or persist or new concerns arise.  Patient Instructions  INSTRUCTIONS FOR UPPER RESPIRATORY INFECTION:  -START QVAR ACCORDING TO INSTRUCTIONS  -ALBUTEROL AS NEEDED  -plenty of rest and fluids  -nasal saline wash 2-3 times daily (use prepackaged nasal saline or bottled/distilled water if making your own)   -clean nose with nasal saline before using the nasal steroid  -can use tylenol or ibuprofen as directed for aches and sorethroat  -in the winter time, using a humidifier at night is helpful (please follow cleaning instructions)  -if you are taking a cough medication - use only as directed, may also try a teaspoon of honey to coat the throat and throat lozenges  -for sore throat, salt water gargles can help  -follow up with your allergist or your doctor  if you have fevers, facial pain, tooth pain, difficulty breathing or are worsening or not getting better in 5-7 days      Jamyiah Labella R.

## 2012-04-29 NOTE — Patient Instructions (Addendum)
INSTRUCTIONS FOR UPPER RESPIRATORY INFECTION:  -START QVAR ACCORDING TO INSTRUCTIONS  -ALBUTEROL AS NEEDED  -plenty of rest and fluids  -nasal saline wash 2-3 times daily (use prepackaged nasal saline or bottled/distilled water if making your own)   -clean nose with nasal saline before using the nasal steroid  -can use tylenol or ibuprofen as directed for aches and sorethroat  -in the winter time, using a humidifier at night is helpful (please follow cleaning instructions)  -if you are taking a cough medication - use only as directed, may also try a teaspoon of honey to coat the throat and throat lozenges  -for sore throat, salt water gargles can help  -follow up with your allergist or your doctor  if you have fevers, facial pain, tooth pain, difficulty breathing or are worsening or not getting better in 5-7 days

## 2012-05-31 LAB — HM PAP SMEAR: HM Pap smear: NORMAL

## 2012-07-19 ENCOUNTER — Ambulatory Visit (INDEPENDENT_AMBULATORY_CARE_PROVIDER_SITE_OTHER): Payer: PRIVATE HEALTH INSURANCE | Admitting: Family Medicine

## 2012-07-19 ENCOUNTER — Encounter: Payer: Self-pay | Admitting: Family Medicine

## 2012-07-19 VITALS — BP 130/68 | Temp 98.3°F | Wt 214.0 lb

## 2012-07-19 DIAGNOSIS — R030 Elevated blood-pressure reading, without diagnosis of hypertension: Secondary | ICD-10-CM

## 2012-07-19 NOTE — Progress Notes (Signed)
  Subjective:    Patient ID: Brianna [name], female    DOB: Jan 19, 1961, 52 y.o.   MRN: [id number]  HPI Acute visit Recent blood pressures have been running 170-187 systolic-when checked at stores, etc. No dizziness.  No headaches.  Mucinex D for allergies recently. Recent frequent Advil and Aleve. No ETOH. Never treated for hypertension Positive FH of hypertension. She has held OTC meds past few days.  Patient does not exercise. She does not currently have home blood pressure cuff  Past Medical History  Diagnosis Date  . Thyroid disease     hypothyroidism  . Infertility, female   . Allergy     allergic rhinitis-- Dr Willa Rough, allergist   No past surgical history on file.  reports that she quit smoking about 22 years ago. Her smoking use included Cigarettes. She smoked 0.00 packs per day for 5 years. She does not have any smokeless tobacco history on file. She reports that she does not drink alcohol. Her drug history is not on file. family history includes Arthritis in her other; Hyperlipidemia in her father; Hypertension in her father and mother; and Stroke in her father. Allergies  Allergen Reactions  . Ciprofloxacin Other (See Comments)    Muscle pain  . Penicillins     REACTION: Rash  . Procaine Hcl     REACTION: Rash      Review of Systems  Constitutional: Negative for fatigue.  Eyes: Negative for visual disturbance.  Respiratory: Negative for cough, chest tightness, shortness of breath and wheezing.   Cardiovascular: Negative for chest pain, palpitations and leg swelling.  Neurological: Negative for dizziness, seizures, syncope, weakness, light-headedness and headaches.       Objective:   Physical Exam  Constitutional: She appears well-developed and well-nourished. No distress.  Neck: Neck supple. No thyromegaly present.  Cardiovascular: Normal rate and regular rhythm.   No murmur heard. Pulmonary/Chest: Effort normal and breath sounds normal. No respiratory  distress. She has no wheezes. She has no rales.  Musculoskeletal: She exhibits no edema.          Assessment & Plan:  Elevated blood pressure. Reading here today is 130/68 left arm seated. We discussed lifestyle management. Avoid regular use of nonsteroidals. Avoid decongestants. Try to lose some weight. Low sodium diet. Reassess with primary as scheduled

## 2012-07-19 NOTE — Patient Instructions (Addendum)

## 2012-07-22 ENCOUNTER — Other Ambulatory Visit: Payer: PRIVATE HEALTH INSURANCE

## 2012-07-22 ENCOUNTER — Telehealth: Payer: Self-pay | Admitting: Internal Medicine

## 2012-07-22 NOTE — Telephone Encounter (Signed)
Caller: Beautiful/Patient; Phone: [phone]; Reason for Call: Wants to know if she needs to keep 9am appointment since she already had blood work done at [facility] office.

## 2012-07-22 NOTE — Telephone Encounter (Signed)
Spoke with [facility]. As long as she had the TSH drawn at teh GYN office, she can bring results. Called pt - confirmed the GYN office did draw a TSH - pt will bring results to 4/25 appt.

## 2012-07-23 ENCOUNTER — Other Ambulatory Visit: Payer: PRIVATE HEALTH INSURANCE

## 2012-07-30 ENCOUNTER — Ambulatory Visit: Payer: PRIVATE HEALTH INSURANCE | Admitting: Internal Medicine

## 2012-08-03 ENCOUNTER — Encounter: Payer: Self-pay | Admitting: Internal Medicine

## 2012-08-03 ENCOUNTER — Ambulatory Visit (INDEPENDENT_AMBULATORY_CARE_PROVIDER_SITE_OTHER): Payer: PRIVATE HEALTH INSURANCE | Admitting: Internal Medicine

## 2012-08-03 VITALS — BP 140/84 | HR 76 | Temp 98.4°F | Wt 215.0 lb

## 2012-08-03 DIAGNOSIS — I10 Essential (primary) hypertension: Secondary | ICD-10-CM

## 2012-08-03 MED ORDER — LOSARTAN POTASSIUM 25 MG PO TABS: 25.0000 mg | ORAL_TABLET | Freq: Every day | ORAL | Status: DC

## 2012-08-03 NOTE — Patient Instructions (Addendum)
Please complete the following lab test within 2 weeks of starting your blood pressure medication: BMET - 401.9

## 2012-08-03 NOTE — Assessment & Plan Note (Signed)
52 year old [place] American female with stage I hypertension. Start losartan 25 mg once daily. Patient strongly encouraged to start exercise program and follow a low sodium diet. Reassess in 4 weeks. Arrange electrolytes and kidney function 2 weeks. Patient understands to hold medication in the event of dehydrating illness. Patient also advised to avoid over-the-counter NSAIDs. BP: 140/84 mmHg

## 2012-08-03 NOTE — Progress Notes (Signed)
  Subjective:    Patient ID: Brianna [name], female    DOB: February 13, 1961, 52 y.o.   MRN: [id number]  HPI  52 year old African American female for followup regarding elevated blood pressure readings. Patient seen by Dr. Caryl Never after bout of bronchitis/upper respiratory infection. Patient was taking OTC cold preps at the time. She has not been monitoring her blood pressure at home. She has family history of hypertension.  Patient has blood work from her GYN. Her thyroid studies are normal. Her vitamin D level was slightly low. She's currently taking 5000 units of vitamin D 3 daily.  Review of Systems Negative for chest pain or shortness of breath    Past Medical History  Diagnosis Date  . Thyroid disease     hypothyroidism  . Infertility, female   . Allergy     allergic rhinitis-- Dr Willa Rough, allergist    History   Social History  . Marital Status: Single    Spouse Name: N/A    Number of Children: N/A  . Years of Education: N/A   Occupational History  . Not on file.   Social History Main Topics  . Smoking status: Former Smoker -- 5 years    Types: Cigarettes    Quit date: 04/07/1990  . Smokeless tobacco: Not on file     Comment: light smoker  . Alcohol Use: No  . Drug Use: Not on file  . Sexually Active: Not on file   Other Topics Concern  . 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    No past surgical history on file.  Family History  Problem Relation Age of Onset  . Hypertension Mother   . Hyperlipidemia Father   . Hypertension Father   . Stroke Father   . Arthritis Other     Allergies  Allergen Reactions  . Ciprofloxacin Other (See Comments)    Muscle pain  . Penicillins     REACTION: Rash  . Procaine Hcl     REACTION: Rash    Current Outpatient Prescriptions on File Prior to Visit  Medication Sig Dispense Refill  . albuterol (VENTOLIN HFA) 108 (90 BASE) MCG/ACT inhaler Inhale 2 puffs  into the lungs daily as needed.        . ciclesonide (ALVESCO) 160 MCG/ACT inhaler Inhale 1 puff into the lungs 2 (two) times daily. Per Dr Willa Rough      . desloratadine (CLARINEX) 5 MG tablet Take 5 mg by mouth daily.        Marland Kitchen levothyroxine (SYNTHROID, LEVOTHROID) 100 MCG tablet Take 1 tablet (100 mcg total) by mouth daily.  90 tablet  3  . montelukast (SINGULAIR) 10 MG tablet Take 10 mg by mouth daily.         No current facility-administered medications on file prior to visit.    BP 140/84  Pulse 76  Temp(Src) 98.4 F (36.9 C) (Oral)  Wt 215 lb (97.523 kg)  BMI 33.16 kg/m2     Objective:   Physical Exam  Constitutional: She appears well-developed and well-nourished.  Cardiovascular: Normal rate and normal heart sounds.   Pulmonary/Chest: Effort normal. She has no wheezes.  Musculoskeletal: She exhibits no edema.  Skin: Skin is warm and dry.  Psychiatric: She has a normal mood and affect. Her behavior is normal.          Assessment & Plan:

## 2012-08-17 ENCOUNTER — Other Ambulatory Visit (INDEPENDENT_AMBULATORY_CARE_PROVIDER_SITE_OTHER): Payer: 59

## 2012-08-17 DIAGNOSIS — I10 Essential (primary) hypertension: Secondary | ICD-10-CM

## 2012-08-17 LAB — BASIC METABOLIC PANEL
BUN: 11 mg/dL (ref 6–23)
Calcium: 9.3 mg/dL (ref 8.4–10.5)
Chloride: 105 mEq/L (ref 96–112)
Potassium: 4.6 mEq/L (ref 3.5–5.1)
Sodium: 138 mEq/L (ref 135–145)

## 2012-09-01 ENCOUNTER — Ambulatory Visit: Payer: PRIVATE HEALTH INSURANCE | Admitting: Internal Medicine

## 2012-09-02 ENCOUNTER — Ambulatory Visit (INDEPENDENT_AMBULATORY_CARE_PROVIDER_SITE_OTHER): Payer: 59 | Admitting: Internal Medicine

## 2012-09-02 ENCOUNTER — Encounter: Payer: Self-pay | Admitting: Internal Medicine

## 2012-09-02 VITALS — BP 150/80 | HR 72 | Temp 97.8°F | Resp 16 | Wt 218.0 lb

## 2012-09-02 DIAGNOSIS — I10 Essential (primary) hypertension: Secondary | ICD-10-CM

## 2012-09-02 MED ORDER — VALSARTAN 80 MG PO TABS: 80.0000 mg | ORAL_TABLET | Freq: Every day | ORAL | Status: DC

## 2012-09-02 NOTE — Patient Instructions (Addendum)
Please complete the following lab tests before your next follow up appointment: BMET - 401.9 

## 2012-09-02 NOTE — Progress Notes (Signed)
Subjective:    Patient ID: Brianna [name], female    DOB: 1960/11/23, 52 y.o.   MRN: [id number]  HPI  52 year old [place] American female for followup regarding hypertension. At previous visit patient was started on losartan 25 mg once daily. Patient reports experiencing cramps in her lower extremity since starting blood pressure medication. She decreased her dose to one half of 25 mg of losartan.  Patient also her complains of dry cough. She denies any symptoms of upper respiratory infection. She has symptoms of postnasal drip.  She has been monitoring her blood pressure at home. Her systolic blood pressure readings range from 120 - 140  Lab Results  Component Value Date   NA 138 08/17/2012   K 4.6 08/17/2012   CL 105 08/17/2012   CO2 27 08/17/2012   Lab Results  Component Value Date   CREATININE 0.7 08/17/2012    Review of Systems Negative for chest pain  Past Medical History  Diagnosis Date  . Thyroid disease     hypothyroidism  . Infertility, female   . Allergy     allergic rhinitis-- Dr Willa Rough, allergist    History   Social History  . Marital Status: Single    Spouse Name: N/A    Number of Children: N/A  . Years of Education: N/A   Occupational History  . Not on file.   Social History Main Topics  . Smoking status: Former Smoker -- 5 years    Types: Cigarettes    Quit date: 04/07/1990  . Smokeless tobacco: Not on file     Comment: light smoker  . Alcohol Use: No  . Drug Use: Not on file  . Sexually Active: Not on file   Other Topics Concern  . 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    No past surgical history on file.  Family History  Problem Relation Age of Onset  . Hypertension Mother   . Hyperlipidemia Father   . Hypertension Father   . Stroke Father   . Arthritis Other     Allergies  Allergen Reactions  . Ciprofloxacin Other (See Comments)    Muscle pain  . Penicillins    REACTION: Rash  . Procaine Hcl     REACTION: Rash    Current Outpatient Prescriptions on File Prior to Visit  Medication Sig Dispense Refill  . albuterol (VENTOLIN HFA) 108 (90 BASE) MCG/ACT inhaler Inhale 2 puffs into the lungs daily as needed.        . Cholecalciferol (VITAMIN D3) 5000 UNITS CAPS Take 1 capsule by mouth daily.      . ciclesonide (ALVESCO) 160 MCG/ACT inhaler Inhale 1 puff into the lungs 2 (two) times daily. Per Dr Willa Rough      . desloratadine (CLARINEX) 5 MG tablet Take 5 mg by mouth daily.        Marland Kitchen levothyroxine (SYNTHROID, LEVOTHROID) 100 MCG tablet Take 1 tablet (100 mcg total) by mouth daily.  90 tablet  3  . montelukast (SINGULAIR) 10 MG tablet Take 10 mg by mouth daily.         No current facility-administered medications on file prior to visit.    BP 150/80  Pulse 72  Temp(Src) 97.8 F (36.6 C) (Oral)  Resp 16  Wt 218 lb (98.884 kg)  BMI 33.62 kg/m2        Objective:   Physical Exam  Constitutional: She is oriented  to person, place, and time. She appears well-developed and well-nourished.  Cardiovascular: Normal rate, regular rhythm and normal heart sounds.   No murmur heard. Pulmonary/Chest: Effort normal. She has no wheezes.  Musculoskeletal: She exhibits no edema.  Neurological: She is alert and oriented to person, place, and time. No cranial nerve deficit.  Skin: Skin is warm and dry.  Psychiatric: She has a normal mood and affect.          Assessment & Plan:

## 2012-09-02 NOTE — Assessment & Plan Note (Signed)
Unclear why patient experiencing lower extremity muscle cramps after starting losartan. Discontinue losartan. Switch to valsartan 80 mg once daily. I doubt her cough related to starting ARB. Her symptoms likely secondary to allergic rhinitis and postnasal drip. Patient advised to use over-the-counter antihistamines and guaifenesin as needed.  Reassess in 6 weeks.

## 2012-10-22 ENCOUNTER — Other Ambulatory Visit (INDEPENDENT_AMBULATORY_CARE_PROVIDER_SITE_OTHER): Payer: 59

## 2012-10-22 DIAGNOSIS — I10 Essential (primary) hypertension: Secondary | ICD-10-CM

## 2012-10-22 LAB — BASIC METABOLIC PANEL
Creatinine, Ser: 0.7 mg/dL (ref 0.4–1.2)
Glucose, Bld: 82 mg/dL (ref 70–99)

## 2012-10-28 ENCOUNTER — Other Ambulatory Visit: Payer: Self-pay | Admitting: Internal Medicine

## 2012-11-02 ENCOUNTER — Encounter: Payer: Self-pay | Admitting: Internal Medicine

## 2012-11-02 ENCOUNTER — Ambulatory Visit (INDEPENDENT_AMBULATORY_CARE_PROVIDER_SITE_OTHER): Payer: 59 | Admitting: Internal Medicine

## 2012-11-02 VITALS — BP 132/94 | HR 72 | Temp 97.6°F | Wt 222.0 lb

## 2012-11-02 DIAGNOSIS — I10 Essential (primary) hypertension: Secondary | ICD-10-CM

## 2012-11-02 DIAGNOSIS — M542 Cervicalgia: Secondary | ICD-10-CM | POA: Insufficient documentation

## 2012-11-02 MED ORDER — VALSARTAN 160 MG PO TABS: 160.0000 mg | ORAL_TABLET | Freq: Every day | ORAL | Status: DC

## 2012-11-02 MED ORDER — METHOCARBAMOL 500 MG PO TABS: 500.0000 mg | ORAL_TABLET | Freq: Three times a day (TID) | ORAL | Status: DC | PRN

## 2012-11-02 NOTE — Patient Instructions (Addendum)
Limit your calories to 1000 cal per day Please complete the following lab tests before your next follow up appointment: BMET - 401.9

## 2012-11-02 NOTE — Progress Notes (Signed)
Subjective:    Patient ID: Brianna [name], female    DOB: [birth year], 52 y.o.   MRN: [id number]  HPI  52 year old [place] American female for followup regarding hypertension. At previous visit patient was started on valsartan 80 mg. Patient was tolerating without difficulty. Her pharmacy sent request for refill for losartan 25 mg. She only took this for last 2 days. She has not been monitoring her blood pressure at home. Her Blood pressure today is 140/80. She denies any adverse effects from valsartan.  Patient also complains of intermittent left-sided neck pain. She attributes to strain in her neck.  Patient denies any radiation of neck pain or upper extremity weakness.  Review of Systems Negative for chest pain, negative for radiation of neck pain  Past Medical History  Diagnosis Date  . Thyroid disease     hypothyroidism  . Infertility, female   . Allergy     allergic rhinitis-- Dr Willa Rough, allergist    History   Social History  . Marital Status: Single    Spouse Name: N/A    Number of Children: N/A  . Years of Education: N/A   Occupational History  . Not on file.   Social History Main Topics  . Smoking status: Former Smoker -- 5 years    Types: Cigarettes    Quit date: 04/07/1990  . Smokeless tobacco: Not on file     Comment: light smoker  . Alcohol Use: No  . Drug Use: Not on file  . Sexually Active: Not on file   Other Topics Concern  . 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    No past surgical history on file.  Family History  Problem Relation Age of Onset  . Hypertension Mother   . Hyperlipidemia Father   . Hypertension Father   . Stroke Father   . Arthritis Other     Allergies  Allergen Reactions  . Ciprofloxacin Other (See Comments)    Muscle pain  . Penicillins     REACTION: Rash  . Procaine Hcl     REACTION: Rash    Current Outpatient Prescriptions on File Prior to Visit   Medication Sig Dispense Refill  . albuterol (VENTOLIN HFA) 108 (90 BASE) MCG/ACT inhaler Inhale 2 puffs into the lungs daily as needed.        . Cholecalciferol (VITAMIN D3) 5000 UNITS CAPS Take 1 capsule by mouth daily.      . ciclesonide (ALVESCO) 160 MCG/ACT inhaler Inhale 1 puff into the lungs 2 (two) times daily. Per Dr Willa Rough      . desloratadine (CLARINEX) 5 MG tablet Take 5 mg by mouth daily.        Marland Kitchen levothyroxine (SYNTHROID, LEVOTHROID) 100 MCG tablet Take 1 tablet (100 mcg total) by mouth daily.  90 tablet  3  . montelukast (SINGULAIR) 10 MG tablet Take 10 mg by mouth daily.         No current facility-administered medications on file prior to visit.    BP 132/94  Pulse 72  Temp(Src) 97.6 F (36.4 C) (Oral)  Wt 222 lb (100.699 kg)  BMI 34.24 kg/m2       Objective:   Physical Exam  Constitutional: She appears well-developed and well-nourished.  Cardiovascular: Normal rate, regular rhythm and normal heart sounds.   Pulmonary/Chest: Effort normal and breath sounds normal. She has no wheezes.  Psychiatric: She has a normal mood and  affect. Her behavior is normal.          Assessment & Plan:

## 2012-11-02 NOTE — Assessment & Plan Note (Signed)
Patient with intermittent left-sided neck pain. I suspect her symptoms are secondary to muscle strain. Performed myofascial release and HVLA to cervical spine. Patient tolerated well. She has no evidence of radiculopathy. Patient advised to use Robaxin as needed.  Patient advised to call office if symptoms persist or worsen.

## 2012-11-02 NOTE — Assessment & Plan Note (Signed)
Blood pressure is still suboptimally controlled. She is tolerating valsartan. Titrate to 160 mg once daily. Electrolytes and renal function before next office visit. Weight loss encouraged.  BP: 132/94 mmHg  (140/80 with manual cuff)

## 2012-12-07 ENCOUNTER — Other Ambulatory Visit: Payer: 59

## 2012-12-07 ENCOUNTER — Other Ambulatory Visit: Payer: Self-pay | Admitting: Internal Medicine

## 2012-12-14 ENCOUNTER — Ambulatory Visit: Payer: 59 | Admitting: Internal Medicine

## 2013-04-03 ENCOUNTER — Other Ambulatory Visit: Payer: Self-pay | Admitting: Internal Medicine

## 2013-04-06 IMAGING — CR DG FOOT COMPLETE 3+V*R*
3 series · 3 of 3 positions shown · non-contrast
Comparison: None

CLINICAL DATA: Right foot sprain, pain at third and fourth
metatarsals

RIGHT FOOT COMPLETE - 3+ VIEW

[view not recorded (1 of 3)]
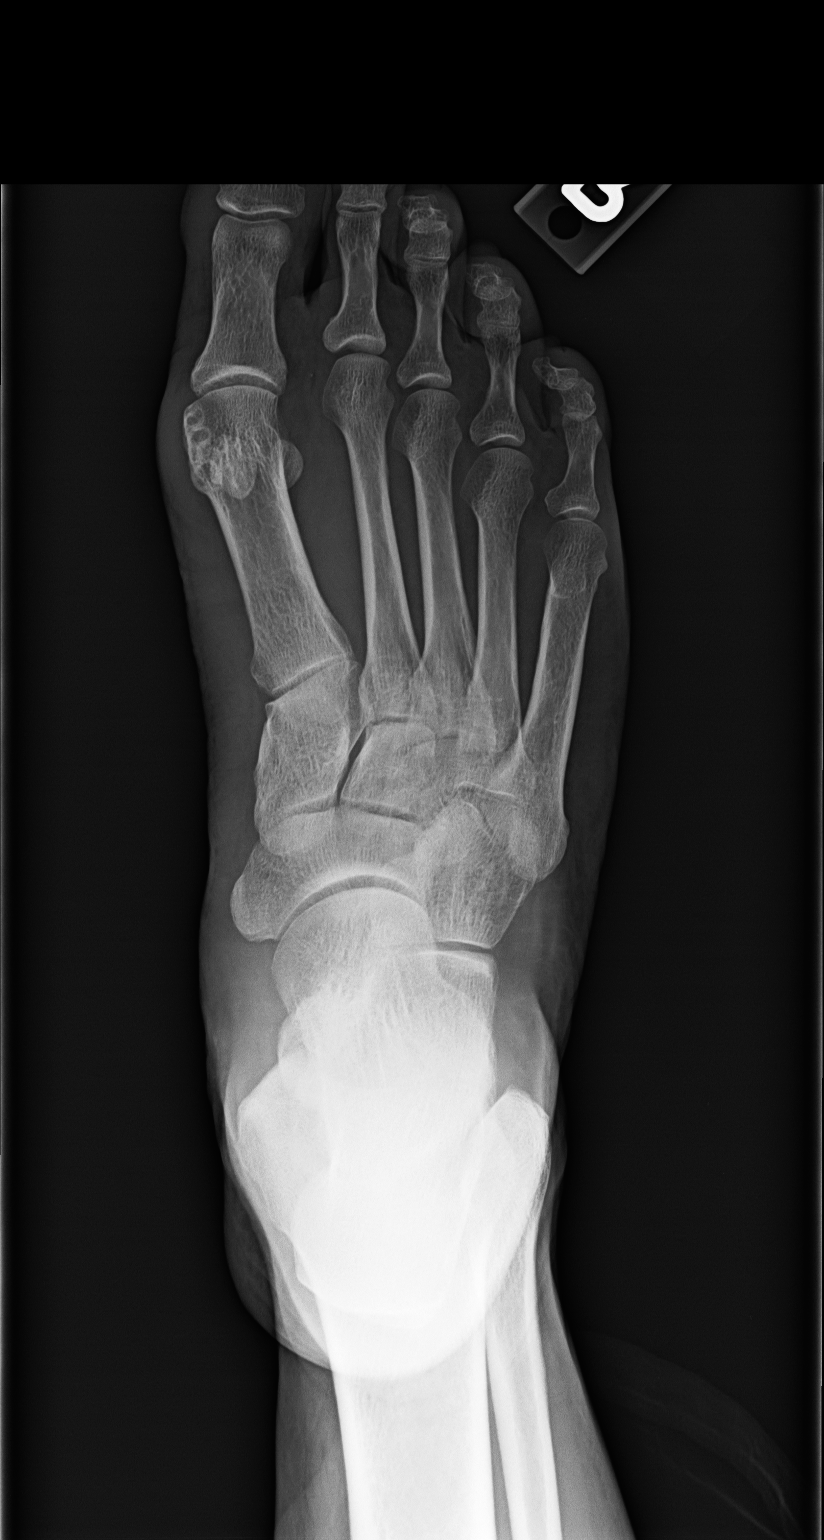

[view not recorded (2 of 3)]
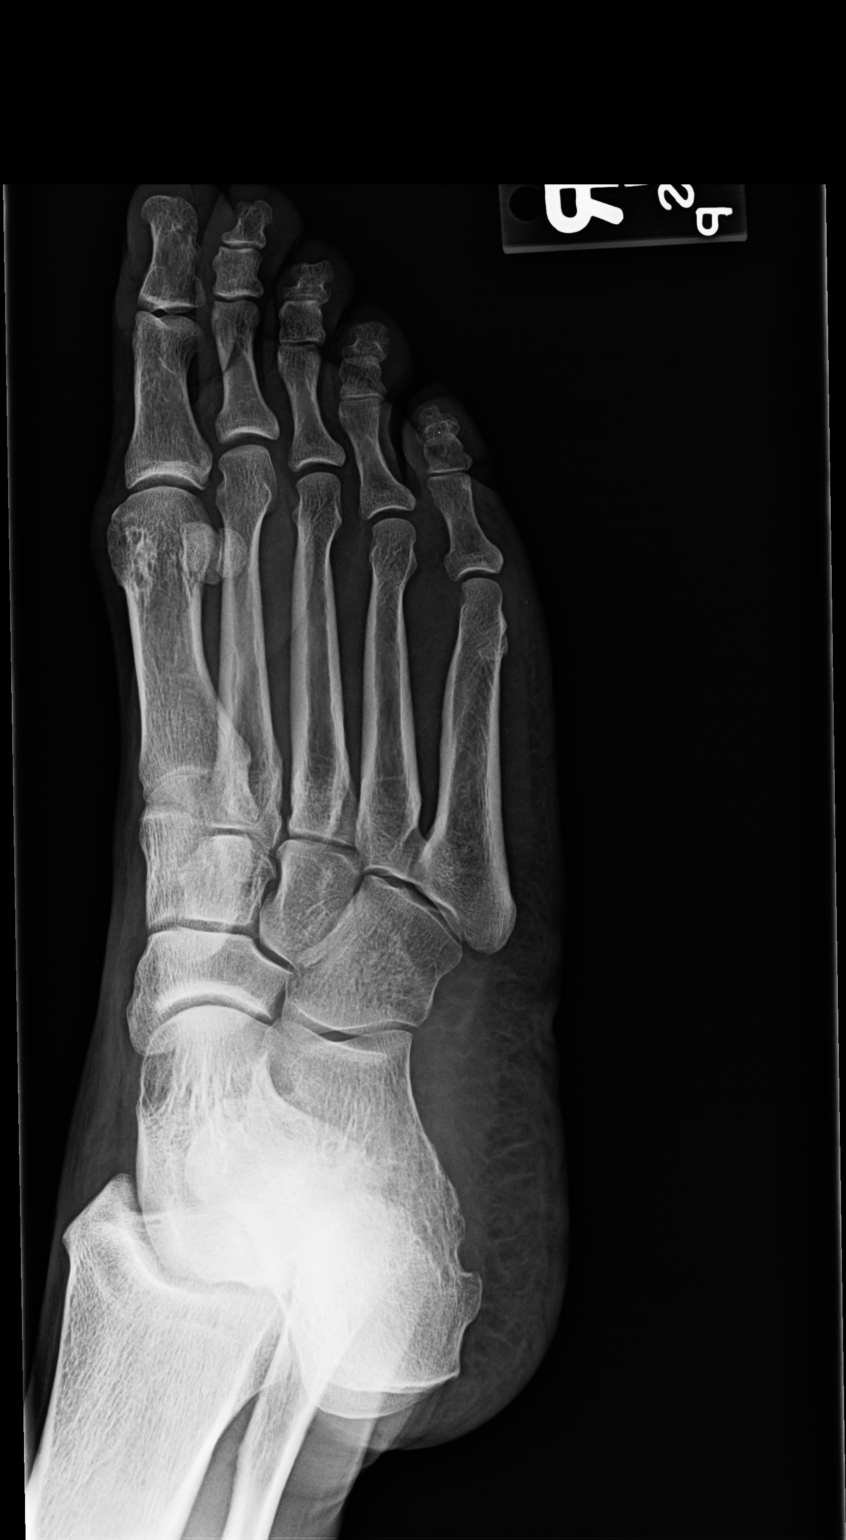

[view not recorded (3 of 3)]
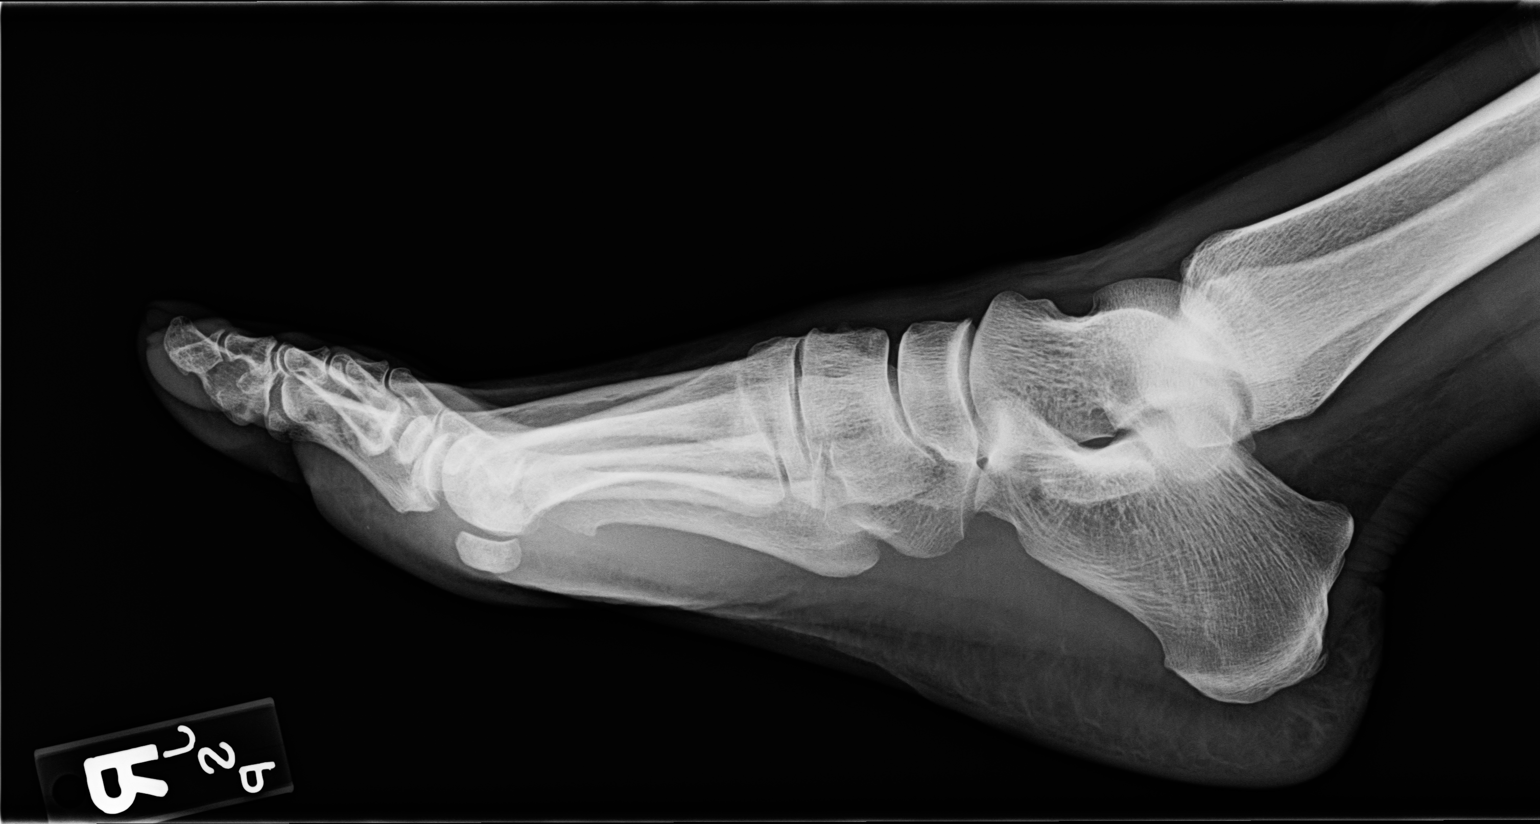

[3 of 3 positions shown; findings below may reference images not displayed]

FINDINGS: Osseous mineralization grossly normal for technique.
Joint spaces preserved.
Small subcortical cystic changes identified at medial margin of the
first metatarsal head.
No acute fracture, dislocation or bone destruction.
IMPRESSION: No acute osseous abnormalities.

## 2013-04-22 ENCOUNTER — Other Ambulatory Visit: Payer: Self-pay | Admitting: Internal Medicine

## 2013-07-28 ENCOUNTER — Other Ambulatory Visit: Payer: Self-pay | Admitting: Internal Medicine

## 2013-10-29 ENCOUNTER — Other Ambulatory Visit: Payer: Self-pay | Admitting: Internal Medicine

## 2014-04-12 ENCOUNTER — Encounter: Payer: Self-pay | Admitting: Family Medicine

## 2014-04-12 ENCOUNTER — Encounter: Payer: Self-pay | Admitting: *Deleted

## 2014-04-12 ENCOUNTER — Ambulatory Visit (INDEPENDENT_AMBULATORY_CARE_PROVIDER_SITE_OTHER): Payer: 59 | Admitting: Family Medicine

## 2014-04-12 VITALS — BP 136/82 | HR 90 | Temp 98.0°F | Ht 67.5 in | Wt 235.1 lb

## 2014-04-12 DIAGNOSIS — J069 Acute upper respiratory infection, unspecified: Secondary | ICD-10-CM

## 2014-04-12 MED ORDER — HYDROCODONE-HOMATROPINE 5-1.5 MG/5ML PO SYRP: 5.0000 mL | ORAL_SOLUTION | Freq: Three times a day (TID) | ORAL | Status: DC | PRN

## 2014-04-12 NOTE — Progress Notes (Addendum)
HPI:  URI: -started:yesterday -symptoms:nasal congestion, sore throat/drainage in throat, cough, body aches, ear pain mild -denies:fever, SOB, sinus pain, NVD -has tried: musinex -sick contacts/travel/risks: denies flu or strep exposure, tick exposure or or Ebola risks, hx asthma or COPD -Hx of: allergies  ROS: See pertinent positives and negatives per HPI.  Past Medical History  Diagnosis Date  . Thyroid disease     hypothyroidism  . Infertility, female   . Allergy     allergic rhinitis-- Dr Willa Rough, allergist    No past surgical history on file.  Family History  Problem Relation Age of Onset  . Hypertension Mother   . Hyperlipidemia Father   . Hypertension Father   . Stroke Father   . Arthritis Other     History   Social History  . Marital Status: Single    Spouse Name: N/A    Number of Children: N/A  . Years of Education: N/A   Social History Main Topics  . Smoking status: Former Smoker -- 5 years    Types: Cigarettes    Quit date: 04/07/1990  . Smokeless tobacco: None     Comment: light smoker  . Alcohol Use: No  . Drug Use: None  . Sexual Activity: None   Other Topics Concern  . 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    Current outpatient prescriptions: albuterol (VENTOLIN HFA) 108 (90 BASE) MCG/ACT inhaler, Inhale 2 puffs into the lungs daily as needed.  , Disp: , Rfl: ;  ciclesonide (ALVESCO) 160 MCG/ACT inhaler, Inhale 1 puff into the lungs 2 (two) times daily. Per Dr Willa Rough, Disp: , Rfl: ;  levocetirizine (XYZAL) 5 MG tablet, Take 5 mg by mouth every evening., Disp: , Rfl:  levothyroxine (SYNTHROID, LEVOTHROID) 100 MCG tablet, TAKE 1 TABLET (100 MCG TOTAL) BY MOUTH DAILY., Disp: 90 tablet, Rfl: 2;  montelukast (SINGULAIR) 10 MG tablet, Take 10 mg by mouth daily.  , Disp: , Rfl: ;  Prenatal Vit w/Fe-Methylfol-FA (ZATEAN-PN) 27-0.6-0.4 MG TABS, Take 1 tablet by mouth daily., Disp: , Rfl: ;   valsartan (DIOVAN) 160 MG tablet, TAKE ONE TABLET BY MOUTH ONE TIME DAILY, Disp: 90 tablet, Rfl: 1 HYDROcodone-homatropine (HYCODAN) 5-1.5 MG/5ML syrup, Take 5 mLs by mouth every 8 (eight) hours as needed for cough., Disp: 120 mL, Rfl: 0  EXAM:  Filed Vitals:   04/12/14 1044  BP: 136/82  Pulse: 90  Temp: 98 F (36.7 C)    Body mass index is 36.26 kg/(m^2).  GENERAL: vitals reviewed and listed above, alert, oriented, appears well hydrated and in no acute distress  HEENT: atraumatic, conjunttiva clear, no obvious abnormalities on inspection of external nose and ears, normal appearance of ear canals and TMs, clear nasal congestion, mild post oropharyngeal erythema with PND, no tonsillar edema or exudate, no sinus TTP  NECK: no obvious masses on inspection  LUNGS: clear to auscultation bilaterally, no wheezes, rales or rhonchi, good air movement  CV: HRRR, no peripheral edema  MS: moves all extremities without noticeable abnormality  PSYCH: pleasant and cooperative, no obvious depression or anxiety  ASSESSMENT AND PLAN:  Discussed the following assessment and plan:  Acute upper respiratory infection - Plan: HYDROcodone-homatropine (HYCODAN) 5-1.5 MG/5ML syrup  -given HPI and exam findings today, a serious infection or illness is unlikely. We discussed potential etiologies, with VURI being most likely, and advised supportive care and monitoring. We discussed treatment side effects, likely course, antibiotic  misuse, transmission, and signs of developing a serious illness. -of course, we advised to return or notify a doctor immediately if symptoms worsen or persist or new concerns arise.    Patient Instructions  INSTRUCTIONS FOR UPPER RESPIRATORY INFECTION:  -plenty of rest and fluids  -nasal saline wash 2-3 times daily (use prepackaged nasal saline or bottled/distilled water if making your own)   -flonase daily for 21 days  -can use AFRIN nasal spray for drainage and nasal  congestion - but do NOT use longer then 3-4 days  -can use tylenol or ibuprofen as directed for aches and sorethroat  -in the winter time, using a humidifier at night is helpful (please follow cleaning instructions)  -if you are taking a cough medication - use only as directed, may also try a teaspoon of honey to coat the throat and throat lozenges  -for sore throat, salt water gargles can help  -follow up if you have fevers, facial pain, tooth pain, difficulty breathing or are worsening or not getting better in 5-7 days      Brianna [name] R.

## 2014-04-12 NOTE — Progress Notes (Signed)
Pre visit review using our clinic review tool, if applicable. No additional management support is needed unless otherwise documented below in the visit note. 

## 2014-04-12 NOTE — Patient Instructions (Signed)
INSTRUCTIONS FOR UPPER RESPIRATORY INFECTION:  -plenty of rest and fluids  -nasal saline wash 2-3 times daily (use prepackaged nasal saline or bottled/distilled water if making your own)   -flonase daily for 21 days  -can use AFRIN nasal spray for drainage and nasal congestion - but do NOT use longer then 3-4 days  -can use tylenol or ibuprofen as directed for aches and sorethroat  -in the winter time, using a humidifier at night is helpful (please follow cleaning instructions)  -if you are taking a cough medication - use only as directed, may also try a teaspoon of honey to coat the throat and throat lozenges  -for sore throat, salt water gargles can help  -follow up if you have fevers, facial pain, tooth pain, difficulty breathing or are worsening or not getting better in 5-7 days

## 2014-05-01 ENCOUNTER — Other Ambulatory Visit: Payer: Self-pay | Admitting: Internal Medicine

## 2014-05-26 ENCOUNTER — Other Ambulatory Visit: Payer: Self-pay | Admitting: Internal Medicine

## 2014-06-21 ENCOUNTER — Ambulatory Visit (INDEPENDENT_AMBULATORY_CARE_PROVIDER_SITE_OTHER): Payer: 59 | Admitting: Family Medicine

## 2014-06-21 ENCOUNTER — Encounter: Payer: Self-pay | Admitting: Family Medicine

## 2014-06-21 VITALS — BP 138/82 | HR 86 | Temp 98.1°F | Ht 68.0 in | Wt 238.3 lb

## 2014-06-21 DIAGNOSIS — E039 Hypothyroidism, unspecified: Secondary | ICD-10-CM

## 2014-06-21 DIAGNOSIS — Z0000 Encounter for general adult medical examination without abnormal findings: Secondary | ICD-10-CM

## 2014-06-21 DIAGNOSIS — E669 Obesity, unspecified: Secondary | ICD-10-CM

## 2014-06-21 DIAGNOSIS — I10 Essential (primary) hypertension: Secondary | ICD-10-CM

## 2014-06-21 DIAGNOSIS — J309 Allergic rhinitis, unspecified: Secondary | ICD-10-CM

## 2014-06-21 LAB — LIPID PANEL
Cholesterol: 152 mg/dL (ref 0–200)
HDL: 40.4 mg/dL (ref >39.00)
LDL Cholesterol: 88 mg/dL (ref 0–99)
NONHDL: 111.6
Total CHOL/HDL Ratio: 4
Triglycerides: 119 mg/dL (ref 0.0–149.0)
VLDL: 23.8 mg/dL (ref 0.0–40.0)

## 2014-06-21 LAB — BASIC METABOLIC PANEL
BUN: 8 mg/dL (ref 6–23)
CALCIUM: 9.3 mg/dL (ref 8.4–10.5)
CO2: 29 meq/L (ref 19–32)
Chloride: 106 mEq/L (ref 96–112)
Creatinine, Ser: 0.71 mg/dL (ref 0.40–1.20)
GFR: 111.4 mL/min (ref >60.00)
Glucose, Bld: 88 mg/dL (ref 70–99)
Potassium: 4.3 mEq/L (ref 3.5–5.1)
Sodium: 140 mEq/L (ref 135–145)

## 2014-06-21 LAB — HEMOGLOBIN A1C: Hgb A1c MFr Bld: 5.8 % (ref 4.6–6.5)

## 2014-06-21 LAB — TSH: TSH: 2.57 u[IU]/mL (ref 0.35–4.50)

## 2014-06-21 MED ORDER — VALSARTAN 160 MG PO TABS: 160.0000 mg | ORAL_TABLET | Freq: Every day | ORAL | Status: DC

## 2014-06-21 NOTE — Progress Notes (Signed)
HPI:  Brianna [name] is here to establish care. She used to see Dr. Artist Pais. She wants her preventive labs today. She declines the flu shot. She does her gyn and breast health with her gynecologist. Does not take vitamin D. Last PCP and physical: sees gyn for female exam  Has the following chronic problems that require follow up and concerns today:  HTN: -meds: valsartan 160 -denies: CP, SOB, DOE  Allergies: -she denies asthma, but she reports her allergist gives her inh to use prn -meds: ceclesonide, alb, xyzal, singulair -sees Dr. Willa Rough  Hypothyroid: -meds: synthroid , looks like the last time she had a thyroid check was few years ago - she reports still taking the medication - maybe refilled by her gyn but she is not sure -denies: hot/cold intol, hx thyroid ca, palpitations, constipation, unexplained weight changes  HM: -HIV  ROS negative for unless reported above: fevers, unintentional weight loss, hearing or vision loss, chest pain, palpitations, struggling to breath, hemoptysis, melena, hematochezia, hematuria, falls, loc, si, thoughts of self harm  Past Medical History  Diagnosis Date  . Thyroid disease     hypothyroidism  . Infertility, female   . Allergy     allergic rhinitis-- Dr Willa Rough, allergist  . Hypertension     Past Surgical History  Procedure Laterality Date  . Tubal ligation      Family History  Problem Relation Age of Onset  . Hypertension Mother   . Hyperlipidemia Father   . Hypertension Father   . Stroke Father   . Arthritis Other     History   Social History  . Marital Status: Single    Spouse Name: N/A  . Number of Children: N/A  . Years of Education: N/A   Social History Main Topics  . Smoking status: Former Smoker -- 5 years    Types: Cigarettes    Quit date: 04/07/1990  . Smokeless tobacco: Not on file     Comment: light smoker  . Alcohol Use: No  . Drug Use: No  . Sexual Activity: Not on file   Other Topics Concern  .  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  levocetirizine (XYZAL) 5 MG tablet, Take 5 mg by mouth every evening., Disp: , Rfl:  .  levothyroxine (SYNTHROID, LEVOTHROID) 100 MCG tablet, TAKE 1 TABLET (100 MCG TOTAL) BY MOUTH DAILY., Disp: 90 tablet, Rfl: 2 .  montelukast (SINGULAIR) 10 MG tablet, Take 10 mg by mouth daily.  , Disp: , Rfl:  .  Prenatal Vit w/Fe-Methylfol-FA (ZATEAN-PN) 27-0.6-0.4 MG TABS, Take 1 tablet by mouth daily., Disp: , Rfl:  .  valsartan (DIOVAN) 160 MG tablet, Take 1 tablet (160 mg total) by mouth daily., Disp: 90 tablet, Rfl: 1 .  albuterol (VENTOLIN HFA) 108 (90 BASE) MCG/ACT inhaler, Inhale 2 puffs into the lungs daily as needed.  , Disp: , Rfl:  .  ciclesonide (ALVESCO) 160 MCG/ACT inhaler, Inhale 1 puff into the lungs 2 (two) times daily. Per Dr Willa Rough, Disp: , Rfl:   EXAM:  Filed Vitals:   06/21/14 0815  BP: 138/82  Pulse: 86  Temp: 98.1 F (36.7 C)    Body mass index  is 36.24 kg/(m^2).  GENERAL: vitals reviewed and listed above, alert, oriented, appears well hydrated and in no acute distress  HEENT: atraumatic, conjunttiva clear, no obvious abnormalities on inspection of external nose and ears  NECK: no obvious masses on inspection  LUNGS: clear to auscultation bilaterally, no wheezes, rales or rhonchi, good air movement  CV: HRRR, no peripheral edema  ABD: BS+, soft, NTTP  GU: declined  RECTAL: declined  MS: moves all extremities without noticeable abnormality  PSYCH: pleasant and cooperative, no obvious depression or anxiety  ASSESSMENT AND PLAN:  Discussed the following assessment and plan:  Visit for preventive health examination - Plan: Lipid Panel, Hemoglobin A1c -We reviewed the PMH, PSH, FH, SH, Meds and Allergies. -We  provided refills for any medications we will prescribe as needed. -We addressed current concerns per orders and patient instructions. -We have advised patient to follow up per instructions below. -all USPSTF level a and b recs discussed -FASTING labs today  Hypothyroidism, unspecified hypothyroidism type - Plan: TSH  Obesity - Plan: Lipid Panel -lifestyle recs discussed at length  Allergic rhinitis, unspecified allergic rhinitis type  Essential hypertension - Plan: Basic metabolic panel     -Patient advised to return or notify a doctor immediately if symptoms worsen or persist or new concerns arise.  Patient Instructions  BEFORE YOU LEAVE: -labs -schedule follow up in 3-4 months  Vit D3 1000 IU daily  We recommend the following healthy lifestyle measures: - eat a healthy diet consisting of lots of vegetables, fruits, beans, nuts, seeds, healthy meats such as white chicken and fish and whole grains.  - avoid fried foods, fast food, processed foods, sodas, red meet and other fattening foods.  - get a least 150 minutes of aerobic exercise per week.       Kriste BasqueKIM, HANNAH R.

## 2014-06-21 NOTE — Patient Instructions (Addendum)
BEFORE YOU LEAVE: -labs -schedule follow up in 3-4 months  Vit D3 1000 IU daily  We recommend the following healthy lifestyle measures: - eat a healthy diet consisting of lots of vegetables, fruits, beans, nuts, seeds, healthy meats such as white chicken and fish and whole grains.  - avoid fried foods, fast food, processed foods, sodas, red meet and other fattening foods.  - get a least 150 minutes of aerobic exercise per week.

## 2014-06-21 NOTE — Progress Notes (Signed)
Pre visit review using our clinic review tool, if applicable. No additional management support is needed unless otherwise documented below in the visit note. 

## 2014-06-22 ENCOUNTER — Telehealth: Payer: Self-pay | Admitting: Internal Medicine

## 2014-06-22 NOTE — Telephone Encounter (Signed)
emmi emailed °

## 2014-08-29 ENCOUNTER — Ambulatory Visit (INDEPENDENT_AMBULATORY_CARE_PROVIDER_SITE_OTHER): Payer: 59 | Admitting: Family Medicine

## 2014-08-29 ENCOUNTER — Encounter: Payer: Self-pay | Admitting: Family Medicine

## 2014-08-29 VITALS — BP 126/75 | HR 76 | Temp 98.5°F | Ht 68.0 in | Wt 238.0 lb

## 2014-08-29 DIAGNOSIS — M25572 Pain in left ankle and joints of left foot: Secondary | ICD-10-CM

## 2014-08-29 MED ORDER — INDOMETHACIN 50 MG PO CAPS
50.0000 mg | ORAL_CAPSULE | Freq: Three times a day (TID) | ORAL | Status: DC
Start: 2014-08-29 — End: 2014-10-26

## 2014-08-29 NOTE — Progress Notes (Signed)
   Subjective:    Patient ID: Brianna [name], female    DOB: [birth year], 54 y.o.   MRN: [id number]  HPI Here for 3 weeks of swelling and pain in the left ankle. She has done nothing for this so far. Around the time this started she remembers dropping a heavy cooking pot on her foot. No recent twists or falls. No other joint issues.    Review of Systems  Constitutional: Negative.   Musculoskeletal: Positive for joint swelling and arthralgias.       Objective:   Physical Exam  Constitutional: She appears well-developed and well-nourished.  Normal gait   Musculoskeletal:  The left lateral ankle is mildly swollen, pink, and tender. Not warm. Full ROM           Assessment & Plan:  This is consistent with a mild case of gout in the ankle, probably triggered by dropping the pot on the foot. Try Indomethacin prn.

## 2014-08-29 NOTE — Progress Notes (Signed)
Pre visit review using our clinic review tool, if applicable. No additional management support is needed unless otherwise documented below in the visit note. 

## 2014-09-14 ENCOUNTER — Ambulatory Visit (INDEPENDENT_AMBULATORY_CARE_PROVIDER_SITE_OTHER): Payer: 59 | Admitting: Family Medicine

## 2014-09-14 ENCOUNTER — Encounter: Payer: Self-pay | Admitting: Family Medicine

## 2014-09-14 VITALS — BP 138/82 | HR 89 | Temp 97.8°F | Ht 68.0 in | Wt 242.5 lb

## 2014-09-14 DIAGNOSIS — R609 Edema, unspecified: Secondary | ICD-10-CM | POA: Diagnosis not present

## 2014-09-14 DIAGNOSIS — M25571 Pain in right ankle and joints of right foot: Secondary | ICD-10-CM

## 2014-09-14 DIAGNOSIS — Z6836 Body mass index (BMI) 36.0-36.9, adult: Secondary | ICD-10-CM

## 2014-09-14 NOTE — Progress Notes (Signed)
Pre visit review using our clinic review tool, if applicable. No additional management support is needed unless otherwise documented below in the visit note. 

## 2014-09-14 NOTE — Patient Instructions (Addendum)
BEFORE YOU LEAVE: -ankle sprain exercises -xray sheet  Go get the xray  Elevated leg for 30 minutes twice daily  Compression socks ([employer] is likely the cheapest)  We recommend the following healthy lifestyle measures: - eat a healthy diet consisting of lots of vegetables, fruits, beans, nuts, seeds, healthy meats such as white chicken and fish  - avoid fried foods, starches, sweets, fast food, processed foods, sodas, red meet and other fattening foods.  - get a least 150 minutes of aerobic exercise per week.   Tylenol 500-1000mg  up to 3 times per day if needed for pain  Let us know if not better in 4 weeks

## 2014-09-14 NOTE — Progress Notes (Signed)
HPI:  Acute visit for:  R foot pain: -started several weeks ago after dropping a heavy pain on the R medial ankle -reports persistent pain in the area where the pan hit and intermittent swelling in this ankle and foot -reports saw Dr. Clent Ridges several weeks ago and did indomethacin for possible gout -denies: weakness, numbness, malaise, CP, SOB, DOE, orthopnea  ROS: See pertinent positives and negatives per HPI.  Past Medical History  Diagnosis Date  . Thyroid disease     hypothyroidism  . Infertility, female   . Allergy     allergic rhinitis-- Dr Willa Rough, allergist  . Hypertension     Past Surgical History  Procedure Laterality Date  . Tubal ligation      Family History  Problem Relation Age of Onset  . Hypertension Mother   . Hyperlipidemia Father   . Hypertension Father   . Stroke Father   . Arthritis Other     History   Social History  . Marital Status: Single    Spouse Name: N/A  . Number of Children: N/A  . Years of Education: N/A   Social History Main Topics  . Smoking status: Former Smoker -- 5 years    Types: Cigarettes    Quit date: 04/07/1990  . Smokeless tobacco: Not on file     Comment: light smoker  . Alcohol Use: No  . Drug Use: No  . Sexual Activity: Not on file   Other Topics Concern  . 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  albuterol (VENTOLIN HFA) 108 (90 BASE) MCG/ACT inhaler, Inhale 2 puffs into the lungs daily as needed.  , Disp: , Rfl:  .  ciclesonide (ALVESCO) 160 MCG/ACT inhaler, Inhale 1 puff into the lungs 2 (two) times daily. Per Dr Willa Rough, Disp: , Rfl:  .  indomethacin (INDOCIN) 50 MG capsule, Take 1 capsule (50 mg total) by mouth 3 (three) times daily with meals., Disp: 60 capsule, Rfl: 0 .  levocetirizine (XYZAL) 5 MG  tablet, Take 5 mg by mouth every evening., Disp: , Rfl:  .  levothyroxine (SYNTHROID, LEVOTHROID) 100 MCG tablet, TAKE 1 TABLET (100 MCG TOTAL) BY MOUTH DAILY., Disp: 90 tablet, Rfl: 2 .  montelukast (SINGULAIR) 10 MG tablet, Take 10 mg by mouth daily.  , Disp: , Rfl:  .  Prenatal Vit w/Fe-Methylfol-FA (ZATEAN-PN) 27-0.6-0.4 MG TABS, Take 1 tablet by mouth daily., Disp: , Rfl:  .  valsartan (DIOVAN) 160 MG tablet, Take 1 tablet (160 mg total) by mouth daily., Disp: 90 tablet, Rfl: 1  EXAM:  Filed Vitals:   09/14/14 1408  BP: 138/82  Pulse: 89  Temp: 97.8 F (36.6 C)    Body mass index is 36.88 kg/(m^2).  GENERAL: vitals reviewed and listed above, alert, oriented, appears well hydrated and in no acute distress  HEENT: atraumatic, conjunttiva clear, no obvious abnormalities on inspection of external nose and ears  NECK: no obvious masses on inspection  LUNGS: clear to auscultation bilaterally, no wheezes, rales or rhonchi, good air movement  CV: HRRR, 1+ bilateral ankle edema to lower 1/3 calf with mild venous stasis dermatitis  MS: moves all extremities without noticeable abnormality, normal gait, TTP R  medial ankle jt line and medial maleolus ifusely in bony and soft tissues, notmal strength and ROM in ankle, NV intact distal, neg talar tilt and neg ant/post drawer  PSYCH: pleasant and cooperative, no obvious depression or anxiety  ASSESSMENT AND PLAN:  Discussed the following assessment and plan:  Edema -suspect venous insufficiency mild, symetric -discussed options nad other etiolgies and she opted for trial elevation, weight reduction, compression  BMI 36.0-36.9,adult -lifestyle recs  Right ankle pain Suspect soft tissue strain, xray to exclude fx or other from the pan injury vs OA, HEP, tyelnol. Advise if not going in 3-4 weeks to call and may need re-eval or sports med referral.  -Patient advised to return or notify a doctor immediately if symptoms worsen or persist  or new concerns arise.  Patient Instructions  BEFORE YOU LEAVE: -ankle sprain exercises  Go get the xray  Elevated leg for 30 minutes twice daily  Compression socks (amazon is likely the cheapest)  We recommend the following healthy lifestyle measures: - eat a healthy diet consisting of lots of vegetables, fruits, beans, nuts, seeds, healthy meats such as white chicken and fish  - avoid fried foods, starches, sweets, fast food, processed foods, sodas, red meet and other fattening foods.  - get a least 150 minutes of aerobic exercise per week.   Tylenol 500-1000mg  up to 3 times per day if needed for pain  Let us know if not better in 4 weeks        Brianna [name] R.

## 2014-10-26 ENCOUNTER — Ambulatory Visit (INDEPENDENT_AMBULATORY_CARE_PROVIDER_SITE_OTHER): Payer: PRIVATE HEALTH INSURANCE | Admitting: Family Medicine

## 2014-10-26 ENCOUNTER — Encounter: Payer: Self-pay | Admitting: Family Medicine

## 2014-10-26 VITALS — BP 142/90 | HR 90 | Temp 97.4°F | Ht 68.0 in | Wt 238.9 lb

## 2014-10-26 DIAGNOSIS — I10 Essential (primary) hypertension: Secondary | ICD-10-CM

## 2014-10-26 DIAGNOSIS — E039 Hypothyroidism, unspecified: Secondary | ICD-10-CM | POA: Diagnosis not present

## 2014-10-26 DIAGNOSIS — E669 Obesity, unspecified: Secondary | ICD-10-CM

## 2014-10-26 MED ORDER — VALSARTAN-HYDROCHLOROTHIAZIDE 160-12.5 MG PO TABS: 1.0000 | ORAL_TABLET | Freq: Every day | ORAL | Status: DC

## 2014-10-26 NOTE — Progress Notes (Signed)
Pre visit review using our clinic review tool, if applicable. No additional management support is needed unless otherwise documented below in the visit note. 

## 2014-10-26 NOTE — Progress Notes (Signed)
HPI:  HTN: -elevated today -meds: valsartan  -denies: CP, SOB, DOE, HA, vision changes  Hypothyroidism: -meds: synthroid daily -denies: skin changes, weight changes  Obesity/Prediabetes: -no regular exercise -diet is not great -denies: vision, fatigue, polyuria, polydipsia   ROS: See pertinent positives and negatives per HPI.  Past Medical History  Diagnosis Date  . Thyroid disease     hypothyroidism  . Infertility, female   . Allergy     allergic rhinitis-- Dr Willa Rough, allergist  . Hypertension     Past Surgical History  Procedure Laterality Date  . Tubal ligation      Family History  Problem Relation Age of Onset  . Hypertension Mother   . Hyperlipidemia Father   . Hypertension Father   . Stroke Father   . Arthritis Other     History   Social History  . Marital Status: Single    Spouse Name: N/A  . Number of Children: N/A  . Years of Education: N/A   Social History Main Topics  . Smoking status: Former Smoker -- 5 years    Types: Cigarettes    Quit date: 04/07/1990  . Smokeless tobacco: Not on file     Comment: light smoker  . Alcohol Use: No  . Drug Use: No  . Sexual Activity: Not on file   Other Topics Concern  . 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  albuterol (VENTOLIN HFA) 108 (90 BASE) MCG/ACT inhaler, Inhale 2 puffs into the lungs daily as needed.  , Disp: , Rfl:  .  ciclesonide (ALVESCO) 160 MCG/ACT inhaler, Inhale 1 puff into the lungs 2 (two) times daily. Per Dr Willa Rough, Disp: , Rfl:  .  levocetirizine (XYZAL) 5 MG tablet, Take 5 mg by mouth every evening., Disp: , Rfl:  .  levothyroxine (SYNTHROID, LEVOTHROID) 100 MCG tablet, TAKE 1 TABLET (100 MCG TOTAL) BY MOUTH DAILY., Disp: 90 tablet, Rfl: 2 .  montelukast  (SINGULAIR) 10 MG tablet, Take 10 mg by mouth daily.  , Disp: , Rfl:  .  Prenatal Vit w/Fe-Methylfol-FA (ZATEAN-PN) 27-0.6-0.4 MG TABS, Take 1 tablet by mouth daily., Disp: , Rfl:  .  valsartan (DIOVAN) 160 MG tablet, Take 1 tablet (160 mg total) by mouth daily., Disp: 90 tablet, Rfl: 1 .  valsartan-hydrochlorothiazide (DIOVAN HCT) 160-12.5 MG per tablet, Take 1 tablet by mouth daily., Disp: 90 tablet, Rfl: 3  EXAM:  Filed Vitals:   10/26/14 0843  BP: 142/90  Pulse: 90  Temp: 97.4 F (36.3 C)    Body mass index is 36.33 kg/(m^2).  GENERAL: vitals reviewed and listed above, alert, oriented, appears well hydrated and in no acute distress  HEENT: atraumatic, conjunttiva clear, no obvious abnormalities on inspection of external nose and ears  NECK: no obvious masses on inspection  LUNGS: clear to auscultation bilaterally, no wheezes, rales or rhonchi, good air movement  CV: HRRR, no peripheral edema  MS: moves all extremities without noticeable abnormality  PSYCH: pleasant and cooperative, no obvious depression or anxiety  ASSESSMENT AND PLAN:  Discussed the following assessment and plan:  Essential hypertension - Plan: valsartan-hydrochlorothiazide (DIOVAN HCT) 160-12.5 MG per tablet  Obesity  Hypothyroidism, unspecified hypothyroidism type  -  Patient advised to return or notify a doctor immediately if symptoms worsen or persist or new concerns arise.  Patient Instructions  BEFORE YOU LEAVE: -schedule follow up in 1-2 months for your blood pressure  Stop the Valsartan (diovan) and START the combo valsartan-hydrochlorthiazide (DIOVAN-HCT)   We recommend the following healthy lifestyle measures: - eat a healthy diet consisting of lots of vegetables, fruits, beans, nuts, seeds, healthy meats such as white chicken and fish and whole grains.  - avoid sweets, starches, carbs, fried foods, fast food, processed foods, sodas, red meet and other fattening foods.  - get a least  150 minutes of aerobic exercise per week.       Kriste Basque R.

## 2014-10-26 NOTE — Patient Instructions (Signed)
BEFORE YOU LEAVE: -schedule follow up in 1-2 months for your blood pressure  Stop the Valsartan (diovan) and START the combo valsartan-hydrochlorthiazide (DIOVAN-HCT)   We recommend the following healthy lifestyle measures: - eat a healthy diet consisting of lots of vegetables, fruits, beans, nuts, seeds, healthy meats such as white chicken and fish and whole grains.  - avoid sweets, starches, carbs, fried foods, fast food, processed foods, sodas, red meet and other fattening foods.  - get a least 150 minutes of aerobic exercise per week.

## 2014-11-24 ENCOUNTER — Encounter: Payer: Self-pay | Admitting: Family Medicine

## 2014-11-24 ENCOUNTER — Ambulatory Visit (INDEPENDENT_AMBULATORY_CARE_PROVIDER_SITE_OTHER): Payer: PRIVATE HEALTH INSURANCE | Admitting: Family Medicine

## 2014-11-24 VITALS — BP 124/78 | HR 85 | Temp 98.2°F | Ht 68.0 in | Wt 235.1 lb

## 2014-11-24 DIAGNOSIS — E669 Obesity, unspecified: Secondary | ICD-10-CM | POA: Diagnosis not present

## 2014-11-24 DIAGNOSIS — R739 Hyperglycemia, unspecified: Secondary | ICD-10-CM

## 2014-11-24 DIAGNOSIS — E039 Hypothyroidism, unspecified: Secondary | ICD-10-CM

## 2014-11-24 DIAGNOSIS — I10 Essential (primary) hypertension: Secondary | ICD-10-CM

## 2014-11-24 DIAGNOSIS — J069 Acute upper respiratory infection, unspecified: Secondary | ICD-10-CM

## 2014-11-24 LAB — HEMOGLOBIN A1C: Hgb A1c MFr Bld: 5.8 % (ref 4.6–6.5)

## 2014-11-24 LAB — BASIC METABOLIC PANEL
BUN: 10 mg/dL (ref 6–23)
CO2: 29 meq/L (ref 19–32)
Calcium: 9.7 mg/dL (ref 8.4–10.5)
Chloride: 103 mEq/L (ref 96–112)
Creatinine, Ser: 0.71 mg/dL (ref 0.40–1.20)
GFR: 111.21 mL/min (ref >60.00)
Glucose, Bld: 88 mg/dL (ref 70–99)
Potassium: 4.2 mEq/L (ref 3.5–5.1)
Sodium: 140 mEq/L (ref 135–145)

## 2014-11-24 LAB — TSH: TSH: 1.85 u[IU]/mL (ref 0.35–4.50)

## 2014-11-24 MED ORDER — HYDROCODONE-HOMATROPINE 5-1.5 MG/5ML PO SYRP: 5.0000 mL | ORAL_SOLUTION | Freq: Every evening | ORAL | Status: DC | PRN

## 2014-11-24 NOTE — Patient Instructions (Addendum)
BEFORE YOU LEAVE: -labs -schedule physical in March 2016  -We have ordered labs or studies at this visit. It can take up to 1-2 weeks for results and processing. We will contact you with instructions IF your results are abnormal. Normal results will be released to your [facility]. If you have not heard from [place] or can not find your results in [facility] in 2 weeks please contact our office.  -PLEASE SIGN UP FOR MYCHART TODAY   We recommend the following healthy lifestyle measures: - eat a healthy diet consisting small portions of vegetables, fruits, beans, nuts, seeds, healthy meats such as white chicken and fish and whole grains.  - avoid starches, sweets, carbs, fried foods, fast food, processed foods, sodas, red meet and other fattening foods.  - get a least 150 minutes of aerobic exercise per week.    INSTRUCTIONS FOR UPPER RESPIRATORY INFECTION:  -plenty of rest and fluids  -nasal saline wash 2-3 times daily (use prepackaged nasal saline or bottled/distilled water if making your own)   -can use AFRIN nasal spray for drainage and nasal congestion - but do NOT use longer then 3-4 days  -can use tylenol (in no history of liver disease) or ibuprofen (if no history of kidney disease, bowel bleeding or significant heart disease) as directed for aches and sorethroat  -in the winter time, using a humidifier at night is helpful (please follow cleaning instructions)  -if you are taking a cough medication - use only as directed, may also try a teaspoon of honey to coat the throat and throat lozenges. If given a cough medication with codeine or hydrocodone or other narcotic please be advised that this contains a strong and  potentially addicting medication. Please follow instructions carefully, take as little as possible and only use AS NEEDED for severe cough. Discuss potential side effects with your pharmacy. Please do not drive or operate machinery while taking these types of medications. Please do  not take other sedating medications, drugs or alcohol while taking this medication without discussing with your doctor.  -for sore throat, salt water gargles can help  -follow up if you have fevers, facial pain, tooth pain, difficulty breathing or are worsening or symptoms persist longer then expected  Upper Respiratory Infection, Adult An upper respiratory infection (URI) is also known as the common cold. It is often caused by a type of germ (virus). Colds are easily spread (contagious). You can pass it to others by kissing, coughing, sneezing, or drinking out of the same glass. Usually, you get better in 1 to 3  weeks.  However, the cough can last for even longer. HOME CARE   Only take medicine as told by your doctor. Follow instructions provided above.  Drink enough water and fluids to keep your pee (urine) clear or pale yellow.  Get plenty of rest.  Return to work when your temperature is < 100 for 24 hours or as told by your doctor. You may use a face mask and wash your hands to stop your cold from spreading. GET HELP RIGHT AWAY IF:   After the first few days, you feel you are getting worse.  You have questions about your medicine.  You have chills, shortness of breath, or red spit (mucus).  You have pain in the face for more then 1-2 days, especially when you bend forward.  You have a fever, puffy (swollen) neck, pain when you swallow, or white spots in the back of your throat.  You have a bad  headache, ear pain, sinus pain, or chest pain.  You have a high-pitched whistling sound when you breathe in and out (wheezing).  You cough up blood.  You have sore muscles or a stiff neck. MAKE SURE YOU:   Understand these instructions.  Will watch your condition.  Will get help right away if you are not doing well or get worse. Document Released: 09/10/2007 Document Revised: 06/16/2011 Document Reviewed: 06/29/2013 [facility] Patient Information 2015 [place], [place]. This  information is not intended to replace advice given to you by your health care provider. Make sure you discuss any questions you have with your health care provider.

## 2014-11-24 NOTE — Progress Notes (Signed)
Pre visit review using our clinic review tool, if applicable. No additional management support is needed unless otherwise documented below in the visit note. 

## 2014-11-24 NOTE — Progress Notes (Signed)
HPI:  HTN: -meds: valsartan 160mg -hctz 25 -denies: CP, SOB, DOE, HA, vision changes  Hypothyroidism: -meds: synthroid daily -denies: skin changes, weight changes  Obesity/Prediabetes: -regular exercise: walking now, using fit bit, improving diet -diet is not great -denies: vision, fatigue, polyuria, polydipsia  URI: -started yesterday -symptoms: cough, congestion, chills, sore throat -denies: fevers, SOB, NVD -no tick bites or travel hx  ROS: See pertinent positives and negatives per HPI.  Past Medical History  Diagnosis Date  . Thyroid disease     hypothyroidism  . Infertility, female   . Allergy     allergic rhinitis-- Dr Willa Rough, allergist  . Hypertension     Past Surgical History  Procedure Laterality Date  . Tubal ligation      Family History  Problem Relation Age of Onset  . Hypertension Mother   . Hyperlipidemia Father   . Hypertension Father   . Stroke Father   . Arthritis Other     Social History   Social History  . Marital Status: Single    Spouse Name: N/A  . Number of Children: N/A  . Years of Education: N/A   Social History Main Topics  . Smoking status: Former Smoker -- 5 years    Types: Cigarettes    Quit date: 04/07/1990  . Smokeless tobacco: None     Comment: light smoker  . Alcohol Use: No  . Drug Use: No  . Sexual Activity: Not Asked   Other Topics Concern  . 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  albuterol (VENTOLIN HFA) 108 (90 BASE) MCG/ACT inhaler, Inhale 2 puffs into the lungs daily as needed.  , Disp: , Rfl:  .  ciclesonide (ALVESCO) 160 MCG/ACT inhaler, Inhale 1 puff into the lungs 2 (two) times daily. Per Dr Willa Rough, Disp: , Rfl:  .  levocetirizine (XYZAL) 5 MG tablet, Take 5 mg by mouth every evening.,  Disp: , Rfl:  .  levothyroxine (SYNTHROID, LEVOTHROID) 100 MCG tablet, TAKE 1 TABLET (100 MCG TOTAL) BY MOUTH DAILY., Disp: 90 tablet, Rfl: 2 .  montelukast (SINGULAIR) 10 MG tablet, Take 10 mg by mouth daily.  , Disp: , Rfl:  .  Prenatal Vit w/Fe-Methylfol-FA (ZATEAN-PN) 27-0.6-0.4 MG TABS, Take 1 tablet by mouth daily., Disp: , Rfl:  .  valsartan (DIOVAN) 160 MG tablet, Take 1 tablet (160 mg total) by mouth daily., Disp: 90 tablet, Rfl: 1 .  valsartan-hydrochlorothiazide (DIOVAN HCT) 160-12.5 MG per tablet, Take 1 tablet by mouth daily., Disp: 90 tablet, Rfl: 3 .  HYDROcodone-homatropine (HYCODAN) 5-1.5 MG/5ML syrup, Take 5 mLs by mouth at bedtime as needed for cough., Disp: 120 mL, Rfl: 0  EXAM:  Filed Vitals:   11/24/14 0849  BP: 124/78  Pulse: 85  Temp: 98.2 F (36.8 C)    Body mass index is 35.76 kg/(m^2).  GENERAL: vitals reviewed and listed above, alert, oriented, appears well hydrated and in no acute distress  HEENT: atraumatic, conjunttiva clear, no obvious abnormalities on inspection of external nose and ears, normal appearance of ear canals and TMs, clear nasal congestion, mild post oropharyngeal erythema with PND, no tonsillar edema or exudate, no sinus TTP  NECK: no obvious masses on inspection  LUNGS: clear to auscultation bilaterally, no wheezes, rales or rhonchi, good air movement  CV: HRRR, no peripheral edema  MS: moves all extremities without noticeable abnormality  PSYCH: pleasant and cooperative, no obvious depression or anxiety  ASSESSMENT AND PLAN:  Discussed the following assessment and plan:  Hypothyroidism, unspecified hypothyroidism type - Plan: TSH  Obesity -lifestyle recs  Essential hypertension - Plan: Basic metabolic panel -much improved!  Hyperglycemia - Plan: Hemoglobin A1c  Acute upper respiratory infection -likely viral or allergic, cough medication -risks discussed, return precuations, supportive care  -Patient advised to return  or notify a doctor immediately if symptoms worsen or persist or new concerns arise.  Patient Instructions  BEFORE YOU LEAVE: -labs -schedule physical in March 2016  -We have ordered labs or studies at this visit. It can take up to 1-2 weeks for results and processing. We will contact you with instructions IF your results are abnormal. Normal results will be released to your [facility]. If you have not heard from [place] or can not find your results in [facility] in 2 weeks please contact our office.  -PLEASE SIGN UP FOR MYCHART TODAY   We recommend the following healthy lifestyle measures: - eat a healthy diet consisting small portions of vegetables, fruits, beans, nuts, seeds, healthy meats such as white chicken and fish and whole grains.  - avoid starches, sweets, carbs, fried foods, fast food, processed foods, sodas, red meet and other fattening foods.  - get a least 150 minutes of aerobic exercise per week.    INSTRUCTIONS FOR UPPER RESPIRATORY INFECTION:  -plenty of rest and fluids  -nasal saline wash 2-3 times daily (use prepackaged nasal saline or bottled/distilled water if making your own)   -can use AFRIN nasal spray for drainage and nasal congestion - but do NOT use longer then 3-4 days  -can use tylenol (in no history of liver disease) or ibuprofen (if no history of kidney disease, bowel bleeding or significant heart disease) as directed for aches and sorethroat  -in the winter time, using a humidifier at night is helpful (please follow cleaning instructions)  -if you are taking a cough medication - use only as directed, may also try a teaspoon of honey to coat the throat and throat lozenges. If given a cough medication with codeine or hydrocodone or other narcotic please be advised that this contains a strong and  potentially addicting medication. Please follow instructions carefully, take as little as possible and only use AS NEEDED for severe cough. Discuss potential side effects with  your pharmacy. Please do not drive or operate machinery while taking these types of medications. Please do not take other sedating medications, drugs or alcohol while taking this medication without discussing with your doctor.  -for sore throat, salt water gargles can help  -follow up if you have fevers, facial pain, tooth pain, difficulty breathing or are worsening or symptoms persist longer then expected  Upper Respiratory Infection, Adult An upper respiratory infection (URI) is also known as the common cold. It is often caused by a type of germ (virus). Colds are easily spread (contagious). You can pass it to others by kissing, coughing, sneezing, or drinking out of the same glass. Usually, you get better in 1 to 3  weeks.  However, the cough can last for even longer. HOME CARE   Only take medicine as told by your doctor. Follow instructions provided above.  Drink enough water and fluids to keep your pee (urine) clear or pale yellow.  Get  plenty of rest.  Return to work when your temperature is < 100 for 24 hours or as told by your doctor. You may use a face mask and wash your hands to stop your cold from spreading. GET HELP RIGHT AWAY IF:   After the first few days, you feel you are getting worse.  You have questions about your medicine.  You have chills, shortness of breath, or red spit (mucus).  You have pain in the face for more then 1-2 days, especially when you bend forward.  You have a fever, puffy (swollen) neck, pain when you swallow, or white spots in the back of your throat.  You have a bad headache, ear pain, sinus pain, or chest pain.  You have a high-pitched whistling sound when you breathe in and out (wheezing).  You cough up blood.  You have sore muscles or a stiff neck. MAKE SURE YOU:   Understand these instructions.  Will watch your condition.  Will get help right away if you are not doing well or get worse. Document Released: 09/10/2007 Document  Revised: 06/16/2011 Document Reviewed: 06/29/2013 [facility] Patient Information 2015 [place], [place]. This information is not intended to replace advice given to you by your health care provider. Make sure you discuss any questions you have with your health care provider.      Kriste Basque R.

## 2015-03-07 ENCOUNTER — Encounter: Payer: Self-pay | Admitting: Allergy and Immunology

## 2015-03-07 ENCOUNTER — Ambulatory Visit (INDEPENDENT_AMBULATORY_CARE_PROVIDER_SITE_OTHER): Payer: PRIVATE HEALTH INSURANCE | Admitting: Allergy and Immunology

## 2015-03-07 VITALS — BP 130/75 | HR 80 | Temp 98.5°F | Resp 16

## 2015-03-07 DIAGNOSIS — J4531 Mild persistent asthma with (acute) exacerbation: Secondary | ICD-10-CM

## 2015-03-07 MED ORDER — DESLORATADINE 5 MG PO TABS: 5.0000 mg | ORAL_TABLET | Freq: Every day | ORAL | Status: DC

## 2015-03-07 MED ORDER — LEVALBUTEROL HCL 1.25 MG/3ML IN NEBU
1.2500 mg | INHALATION_SOLUTION | Freq: Once | RESPIRATORY_TRACT | Status: AC
Start: 2015-03-07 — End: 2015-03-07
  Administered 2015-03-07: 1.25 mg via RESPIRATORY_TRACT

## 2015-03-07 MED ORDER — FLUTICASONE PROPIONATE HFA 110 MCG/ACT IN AERO: 2.0000 | INHALATION_SPRAY | Freq: Two times a day (BID) | RESPIRATORY_TRACT | Status: AC

## 2015-03-07 MED ORDER — IPRATROPIUM BROMIDE 0.02 % IN SOLN
0.5000 mg | Freq: Once | RESPIRATORY_TRACT | Status: AC
  Administered 2015-03-07: 0.5 mg via RESPIRATORY_TRACT

## 2015-03-07 NOTE — Patient Instructions (Signed)
Take Home Sheet  1. Avoidance: Significant temperature changes and irritant exposures (code orange/red days).  2. Antihistamine: Clarinex 5 mg by mouth once daily for runny nose or itching.     Stop Xyzal 3. Nasal Spray: Astepro 1-2 spray(s) each nostril twice daily for stuffy nose or drainage.   4. Inhalers:  Rescue: Ventolin 2 puffs every 4 hours as needed for cough or wheeze.       -May use to puffs 10-20 minutes prior to exercise.   Preventative: Flovent 110 mcg 2 puffs twice daily (Rinse, gargle, and spit out after use).  5. Other: Continue Singulair 10 mg daily.  6.  Prednisone 20 mg once daily for 2 days.  7. Nasal Saline wash followed by nasal spray twice daily as directed.   8. Follow up Visit: 4 months or sooner if needed.   Websites that have reliable Patient information: 1. American Academy of Asthma, Allergy, & Immunology: www.aaaai.org 2. [employer]ergy [employer]: www.foodallergy.org 3. Mothers of Asthmatics: www.aanma.org 4. [facility]: https://www.strong.com/www.njc.org 5. American College of Allergy, Asthma, & Immunology: [employer]ger[employer].iswww.allergy.[employer].edu or www.acaai.org

## 2015-03-07 NOTE — Progress Notes (Signed)
FOLLOW UP NOTE  RE: Brianna PertGwen [name] Shackett MRN: [id number] DOB: 11-Jul-1960 [facility] [address] [place] [id number] Date of Office Visit: 03/07/2015  Subjective:  Brianna [name] is a 54 y.o. female who presents today for Cough; Wheezing; and Nasal Congestion  Assessment:   1. Mild persistent asthma, with acute exacerbation, in no respiratory distress with clear lung exam.    2.      Allergic  Rhinoconjunctivitis. 3.      Incomplete medication adherence.  Plan:   Meds ordered this encounter  Medications  . fluticasone (FLOVENT HFA) 110 MCG/ACT inhaler    Sig: Inhale 2 puffs into the lungs 2 (two) times daily.    Dispense:  1 Inhaler    Refill:  3  . desloratadine (CLARINEX) 5 MG tablet    Sig: Take 1 tablet (5 mg total) by mouth daily.    Dispense:  30 tablet    Refill:  5  . levalbuterol (XOPENEX) nebulizer solution 1.25 mg    Sig:   . ipratropium (ATROVENT) nebulizer solution 0.5 mg    Sig:    Patient Instructions   1. Avoidance: Significant temperature changes and irritant exposures (code orange/red days). 2. Antihistamine: Clarinex 5 mg by mouth once daily for runny nose or itching.     Stop Xyzal 3. Nasal Spray: Astepro 1-2 spray(s) each nostril twice daily for stuffy nose or drainage.  4. Inhalers:  Rescue: Ventolin 2 puffs every 4 hours as needed for cough or wheeze.       -May use to puffs 10-20 minutes prior to exercise.  Preventative: Flovent 110 mcg 2 puffs twice daily (Rinse, gargle, and spit out after use). 5. Other: Continue Singulair 10 mg daily. 6.  Prednisone 20 mg once daily for 2 days. 7. Nasal Saline wash followed by nasal spray twice daily as directed. 8. Follow up Visit: 4 months or sooner if needed.  HPI:   Brianna SkeensGwen returns to the office with intermittent symptoms over the last 3 days.  She describes slight scratchy throat/headache/wheeze and shortness of breath. Her cough is most  bothersome, though nonproductive and therefore, she has been using Aerospan  twice daily for the last 3 days with Ventolin once a day.  She describes her sleep is okay and no disruption of activity.  Prior to her current symptoms, the remainder of the summer was pretty good without emergency department or urgent care visits, prednisone or antibiotics.  Recently she has not used an eyedrop.  She does prefer to Clarinex to Xyzal.   Current Medications: 1.  Singular 10 mg daily. 2.  Xyzal 5 mg daily. 3.  Azelastine 1-2 sprays once daily as needed. 4.  Ventolin as needed. 5.  Aerospan 2 puffs once to twice daily. 6.  Diovan and iron/multivitamin daily.  Drug Allergies: Allergies  Allergen Reactions  . Ciprofloxacin Other (See Comments)    Muscle pain  . Penicillins     REACTION: Rash  . Procaine Hcl     REACTION: Rash    Objective:   Filed Vitals:   03/07/15 1539  BP: 130/75  Pulse: 80  Temp: 98.5 F (36.9 C)  Resp: 16   Physical Exam  Constitutional: She is well-developed, well-nourished, and in no distress.  HENT:  Head: Atraumatic.  Right Ear: Tympanic membrane and ear canal normal.  Left Ear: Tympanic membrane and ear canal normal.  Nose: Mucosal edema present. No rhinorrhea. No  epistaxis.  Mouth/Throat: Oropharynx is clear and moist and mucous membranes are normal. No oropharyngeal exudate, posterior oropharyngeal edema or posterior oropharyngeal erythema.  Neck: Neck supple.  Cardiovascular: Normal rate, S1 normal and S2 normal.   No murmur heard. Pulmonary/Chest: Effort normal. She has no wheezes. She has no rhonchi. She has no rales.  Post Xopenex/Atrovent neb continues to be clear without adventitious breath sounds.  Patient reports improved.   Lymphadenopathy:    She has no cervical adenopathy.    Diagnostics: Spirometry: FVC 2.55--80%, FEV1 2.08--82%.  Postbronchodilator improvement,  FVC 2.75--86%, FEV1 2.37--93%.     Brianna M. Willa Rough, MD  cc: Kriste Basque, MD

## 2015-05-14 ENCOUNTER — Ambulatory Visit (INDEPENDENT_AMBULATORY_CARE_PROVIDER_SITE_OTHER): Payer: PRIVATE HEALTH INSURANCE | Admitting: Family Medicine

## 2015-05-14 ENCOUNTER — Encounter: Payer: Self-pay | Admitting: Family Medicine

## 2015-05-14 VITALS — BP 130/78 | HR 90 | Temp 98.3°F | Ht 68.0 in | Wt 237.8 lb

## 2015-05-14 DIAGNOSIS — J309 Allergic rhinitis, unspecified: Secondary | ICD-10-CM

## 2015-05-14 DIAGNOSIS — R059 Cough, unspecified: Secondary | ICD-10-CM

## 2015-05-14 DIAGNOSIS — R05 Cough: Secondary | ICD-10-CM

## 2015-05-14 DIAGNOSIS — J209 Acute bronchitis, unspecified: Secondary | ICD-10-CM

## 2015-05-14 MED ORDER — PREDNISONE 20 MG PO TABS: 40.0000 mg | ORAL_TABLET | Freq: Every day | ORAL | Status: DC

## 2015-05-14 NOTE — Progress Notes (Signed)
HPI:  Brianna [name] is a pleasant 55 yo with a PMH significant for ashtma and allergies (sees specialist), HTN and hypothyroidism here for an acute visit fo:  URI: -started: 2 weeks ago -symptoms:nasal congestion, sore throat, cough, fevers initially, some wheezing and mild SOB - reports improved but with persistent non-productive cough -denies:fever,  NVD, tooth pain, sinus pain, flu exposure -has tried: meds per below -Hx of: allergies, mild persistent asthma - meds include xyzal, clarinex, singulair - reports doe snot use flovent because makes her have a sore throat and she "does not have asthma" report her asthma doctor keeps "pushing this on her" but has not told her "for sure" that she has asthma and pt feels she does not have it  ROS: See pertinent positives and negatives per HPI.  Past Medical History  Diagnosis Date  . Thyroid disease     hypothyroidism  . Infertility, female   . Allergy     allergic rhinitis-- Dr Willa Rough, allergist  . Hypertension     Past Surgical History  Procedure Laterality Date  . Tubal ligation      Family History  Problem Relation Age of Onset  . Hypertension Mother   . Hyperlipidemia Father   . Hypertension Father   . Stroke Father   . Arthritis Other     Social History   Social History  . Marital Status: Single    Spouse Name: N/A  . Number of Children: N/A  . Years of Education: N/A   Social History Main Topics  . Smoking status: Former Smoker -- 5 years    Types: Cigarettes    Quit date: 04/07/1990  . Smokeless tobacco: None     Comment: light smoker  . Alcohol Use: No  . Drug Use: No  . Sexual Activity: Not Asked   Other Topics Concern  . 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  albuterol (VENTOLIN HFA) 108  (90 BASE) MCG/ACT inhaler, Inhale 2 puffs into the lungs daily as needed.  , Disp: , Rfl:  .  ciclesonide (ALVESCO) 160 MCG/ACT inhaler, Inhale 1 puff into the lungs 2 (two) times daily. Per Dr Willa Rough, Disp: , Rfl:  .  desloratadine (CLARINEX) 5 MG tablet, Take 1 tablet (5 mg total) by mouth daily., Disp: 30 tablet, Rfl: 5 .  fluticasone (FLOVENT HFA) 110 MCG/ACT inhaler, Inhale 2 puffs into the lungs 2 (two) times daily., Disp: 1 Inhaler, Rfl: 3 .  HYDROcodone-homatropine (HYCODAN) 5-1.5 MG/5ML syrup, Take 5 mLs by mouth at bedtime as needed for cough. (Patient not taking: Reported on 03/07/2015), Disp: 120 mL, Rfl: 0 .  levocetirizine (XYZAL) 5 MG tablet, Take 5 mg by mouth every evening., Disp: , Rfl:  .  levothyroxine (SYNTHROID, LEVOTHROID) 100 MCG tablet, TAKE 1 TABLET (100 MCG TOTAL) BY MOUTH DAILY., Disp: 90 tablet, Rfl: 2 .  montelukast (SINGULAIR) 10 MG tablet, Take 10 mg by mouth daily.  , Disp: , Rfl:  .  predniSONE (DELTASONE) 20 MG tablet, Take 2 tablets (40 mg total) by mouth daily with breakfast., Disp: 8 tablet, Rfl: 0 .  Prenatal Vit w/Fe-Methylfol-FA (ZATEAN-PN) 27-0.6-0.4 MG TABS, Take 1 tablet by mouth daily., Disp: ,  Rfl:  .  valsartan (DIOVAN) 160 MG tablet, Take 1 tablet (160 mg total) by mouth daily. (Patient not taking: Reported on 03/07/2015), Disp: 90 tablet, Rfl: 1 .  valsartan-hydrochlorothiazide (DIOVAN HCT) 160-12.5 MG per tablet, Take 1 tablet by mouth daily., Disp: 90 tablet, Rfl: 3  EXAM:  Filed Vitals:   05/14/15 1535  BP: 130/78  Pulse: 90  Temp: 98.3 F (36.8 C)    Body mass index is 36.17 kg/(m^2).  GENERAL: vitals reviewed and listed above, alert, oriented, appears well hydrated and in no acute distress  HEENT: atraumatic, conjunttiva clear, no obvious abnormalities on inspection of external nose and ears, normal appearance of ear canals and TMs, clear nasal congestion, mild post oropharyngeal erythema with PND, no tonsillar edema or exudate, no sinus  TTP  NECK: no obvious masses on inspection  LUNGS: clear to auscultation bilaterally, no wheezes, rales or rhonchi, good air movement  CV: HRRR, no peripheral edema  MS: moves all extremities without noticeable abnormality  PSYCH: pleasant and cooperative, no obvious depression or anxiety  ASSESSMENT AND PLAN:  Discussed the following assessment and plan:  Cough - Plan: DG Chest 2 View  Acute bronchitis, unspecified organism  Allergic rhinitis, unspecified allergic rhinitis type  -given HPI and exam findings today, a serious infection or illness is unlikely. We discussed potential etiologies, with VURI being most likely with possible bronchitis without increased sputum production. Advised prednisone and CXR if symptoms persist. Discussed dx and tx of asthma briefly and advised she address her asthma concerns with her treating physician. We discussed treatment side effects, likely course, antibiotic misuse, transmission, and signs of developing a serious illness. -she refused the flu shot -she refused labs today and wants to do at follow up in march -of course, we advised to return or notify a doctor immediately if symptoms worsen or persist or new concerns arise.    Patient Instructions  BEFORE YOU LEAVE: -follow up in march as scheduled, plan labs then  Take the prednisone as instructed  Try the tessalon for cough  Get the chest xray if you do not start to improve with the treatment  Follow up with your asthma doctor and address issues with your medications and your concern regarding the diagnosis of asthma  Seek care if worsening, new symptoms or persistent symptoms     KIM, HANNAH R.

## 2015-05-14 NOTE — Progress Notes (Signed)
Pre visit review using our clinic review tool, if applicable. No additional management support is needed unless otherwise documented below in the visit note. 

## 2015-05-14 NOTE — Patient Instructions (Addendum)
BEFORE YOU LEAVE: -follow up in march as scheduled, plan labs then  Take the prednisone as instructed  Try the tessalon for cough  Get the chest xray if you do not start to improve with the treatment  Follow up with your asthma doctor and address issues with your medications and your concern regarding the diagnosis of asthma  Seek care if worsening, new symptoms or persistent symptoms

## 2015-06-02 ENCOUNTER — Other Ambulatory Visit: Payer: Self-pay | Admitting: Allergy and Immunology

## 2015-06-13 LAB — HM DEXA SCAN: HM Dexa Scan: NORMAL

## 2015-06-25 ENCOUNTER — Encounter: Payer: PRIVATE HEALTH INSURANCE | Admitting: Family Medicine

## 2015-07-06 ENCOUNTER — Ambulatory Visit (INDEPENDENT_AMBULATORY_CARE_PROVIDER_SITE_OTHER): Payer: PRIVATE HEALTH INSURANCE | Admitting: Family Medicine

## 2015-07-06 ENCOUNTER — Encounter: Payer: Self-pay | Admitting: Family Medicine

## 2015-07-06 VITALS — BP 104/74 | HR 88 | Temp 98.4°F | Ht 68.0 in | Wt 239.7 lb

## 2015-07-06 DIAGNOSIS — I10 Essential (primary) hypertension: Secondary | ICD-10-CM | POA: Diagnosis not present

## 2015-07-06 DIAGNOSIS — J309 Allergic rhinitis, unspecified: Secondary | ICD-10-CM

## 2015-07-06 DIAGNOSIS — R059 Cough, unspecified: Secondary | ICD-10-CM

## 2015-07-06 DIAGNOSIS — E039 Hypothyroidism, unspecified: Secondary | ICD-10-CM | POA: Diagnosis not present

## 2015-07-06 DIAGNOSIS — E669 Obesity, unspecified: Secondary | ICD-10-CM

## 2015-07-06 DIAGNOSIS — R05 Cough: Secondary | ICD-10-CM

## 2015-07-06 DIAGNOSIS — Z0000 Encounter for general adult medical examination without abnormal findings: Secondary | ICD-10-CM

## 2015-07-06 DIAGNOSIS — R739 Hyperglycemia, unspecified: Secondary | ICD-10-CM

## 2015-07-06 LAB — BASIC METABOLIC PANEL
BUN: 8 mg/dL (ref 6–23)
CALCIUM: 9.8 mg/dL (ref 8.4–10.5)
CO2: 29 meq/L (ref 19–32)
CREATININE: 0.77 mg/dL (ref 0.40–1.20)
Chloride: 101 mEq/L (ref 96–112)
GFR: 101.03 mL/min (ref >60.00)
GLUCOSE: 98 mg/dL (ref 70–99)
Potassium: 4.4 mEq/L (ref 3.5–5.1)
Sodium: 138 mEq/L (ref 135–145)

## 2015-07-06 LAB — LIPID PANEL
CHOL/HDL RATIO: 4
Cholesterol: 181 mg/dL (ref 0–200)
HDL: 41.7 mg/dL (ref >39.00)
LDL Cholesterol: 117 mg/dL — ABNORMAL HIGH (ref 0–99)
NonHDL: 139.72
TRIGLYCERIDES: 116 mg/dL (ref 0.0–149.0)
VLDL: 23.2 mg/dL (ref 0.0–40.0)

## 2015-07-06 LAB — HEMOGLOBIN A1C: Hgb A1c MFr Bld: 5.9 % (ref 4.6–6.5)

## 2015-07-06 LAB — TSH: TSH: 2.13 u[IU]/mL (ref 0.35–4.50)

## 2015-07-06 NOTE — Patient Instructions (Signed)
Before you leave: -Labs -Follow up in 4-6 months  Please take your inhalers as instructed by your allergy and asthma specialist. Please follow up with your specialist as needed.  We recommend the following healthy lifestyle measures: - eat a healthy whole foods diet consisting of regular small meals composed of vegetables, fruits, beans, nuts, seeds, healthy meats such as white chicken and fish and whole grains.  - avoid sweets, white starchy foods, fried foods, fast food, processed foods, sodas, red meet and other fattening foods.  - get a least 150-300 minutes of aerobic exercise per week.   -We have ordered labs or studies at this visit. It can take up to 1-2 weeks for results and processing. We will contact you with instructions IF your results are abnormal. Normal results will be released to [facility]. If you have not heard from us or can not find your results in [facility] in 2 weeks please contact our office.

## 2015-07-06 NOTE — Progress Notes (Signed)
HPI:  Here for CPE:  -Concerns and/or follow up today:   HTN: -meds: valsartan 160mg -hctz 25 -denies: CP, SOB, DOE, HA, vision changes -hx LE edema  Hypothyroidism: -meds: synthroid 100mcg daily -denies: skin changes, weight changes  Obesity/Prediabetes: -regular exercise: walking now, using fit bit, improving diet -diet is not great -denies: vision, fatigue, polyuria, polydipsia  Seasonal Allergies: -sees Dr. Willa RoughHicks -denies asthma, but on inhalers  -Diet: Started eating healthy a bout 2 weeks ago-variety of foods, balance and well rounded  -Exercise: Started walking for 30 minutes 4 days per week about 2 weeks ago  -Taking folic acid, vitamin D or calcium: Yes, is taking vitamin D daily  -Diabetes and Dyslipidemia Screening: Fasting for lab work today  -Vaccines: Declined vaccines  -pap history: sees gyn  -sexual activity: yes, female partner, no new partners  -wants STI testing (Hep C if born [id number]): no  -FH breast, colon or ovarian ca: see FH Last mammogram: sees gyn Last colon cancer screening[place] age 55 [place].  Sees gyn for breast and bone health  -Alcohol, Tobacco, drug use: see social history  Review of Systems - no fevers, unintentional weight loss, vision loss, hearing loss, chest pain, sob, hemoptysis, melena, hematochezia, hematuria, genital discharge, changing or concerning skin lesions, bleeding, bruising, loc, thoughts of self harm or SI  Past Medical History  Diagnosis Date  . Thyroid disease     hypothyroidism  . Infertility, female   . Allergy     allergic rhinitis-- Dr Willa RoughHicks, allergist  . Hypertension     Past Surgical History  Procedure Laterality Date  . Tubal ligation      Family History  Problem Relation Age of Onset  . Hypertension Mother   . Hyperlipidemia Father   . Hypertension Father   . Stroke Father   . Arthritis Other     Social History   Social History  .  Marital Status: Single    Spouse Name: N/A  . Number of Children: N/A  . Years of Education: N/A   Social History Main Topics  . Smoking status: Former Smoker -- 5 years    Types: Cigarettes    Quit date: 04/07/1990  . Smokeless tobacco: None     Comment: light smoker  . Alcohol Use: No  . Drug Use: No  . Sexual Activity: Not Asked   Other Topics Concern  . 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  albuterol (VENTOLIN HFA) 108 (90 BASE) MCG/ACT inhaler, Inhale 2 puffs into the lungs daily as needed.  , Disp: , Rfl:  .  ciclesonide (ALVESCO) 160 MCG/ACT inhaler, Inhale 1 puff into the lungs 2 (two) times daily. Per Dr Willa RoughHicks, Disp: , Rfl:  .  desloratadine (CLARINEX) 5 MG tablet, Take 1 tablet (5 mg total) by mouth daily., Disp: 30 tablet, Rfl: 5 .  fluticasone (FLOVENT HFA) 110 MCG/ACT inhaler, Inhale 2 puffs into the lungs 2 (two) times daily., Disp: 1 Inhaler, Rfl: 3 .  levocetirizine (XYZAL) 5 MG tablet, Take 5 mg by mouth every evening., Disp: , Rfl:  .  levothyroxine (SYNTHROID, LEVOTHROID) 100 MCG tablet, TAKE 1 TABLET (  100 MCG TOTAL) BY MOUTH DAILY., Disp: 90 tablet, Rfl: 2 .  Prenatal Vit w/Fe-Methylfol-FA (ZATEAN-PN) 27-0.6-0.4 MG TABS, Take 1 tablet by mouth daily., Disp: , Rfl:  .  SINGULAIR 10 MG tablet, TAKE ONE TABLET EACH EVENING TO PREVENT COUGH OR WHEEZE., Disp: 30 tablet, Rfl: 3 .  valsartan-hydrochlorothiazide (DIOVAN HCT) 160-12.5 MG per tablet, Take 1 tablet by mouth daily., Disp: 90 tablet, Rfl: 3  EXAM:  Filed Vitals:   07/06/15 0919  BP: 104/74  Pulse: 88  Temp: 98.4 F (36.9 C)    GENERAL: vitals reviewed and listed below, alert, oriented, appears well hydrated and in no acute distress  HEENT: head atraumatic, PERRLA, normal appearance of  eyes, ears, nose and mouth. moist mucus membranes.  NECK: supple, no masses or lymphadenopathy  LUNGS: clear to auscultation bilaterally, no rales, rhonchi or wheeze  CV: HRRR, no peripheral edema or cyanosis, normal pedal pulses  BREAST: Declined  ABDOMEN: bowel sounds normal, soft, non tender to palpation, no masses, no rebound or guarding  GU: Declined  SKIN: no rash or abnormal lesions, declined full skin check  MS: normal gait, moves all extremities normally  NEURO: CN II-XII grossly intact, normal muscle strength and sensation to light touch on extremities  PSYCH: normal affect, pleasant and cooperative  ASSESSMENT AND PLAN:  Discussed the following assessment and plan:  Visit for preventive health examination - Plan: Lipid Panel  Hypothyroidism, unspecified hypothyroidism type - Plan: TSH  Obesity  Essential hypertension - Plan: Basic metabolic panel  Hyperglycemia - Plan: Hemoglobin A1c  Allergic rhinitis, unspecified allergic rhinitis type  Cough  -Congratulated on lifestyle changes and supported  -Advised that she bring copy of her colonoscopy report to our office, or provide [place] with name and phone number for gastroenterology office  -Discussed and advised all [place] preventive services health task force level A and B recommendations for age, sex and risks.  -Advised at least 150 minutes of exercise per week and a healthy diet low in saturated fats and sweets and consisting of fresh fruits and vegetables, lean meats such as fish and white chicken and whole grains.  -FASTING labs, studies and vaccines per orders this encounter  Orders Placed This Encounter  Procedures  . Lipid Panel  . Hemoglobin A1c  . TSH  . Basic metabolic panel    Patient advised to return to clinic immediately if symptoms worsen or persist or new concerns.  Patient Instructions  Before you leave: -Labs -Follow up in 4-6 months  Please take your inhalers as instructed by your  allergy and asthma specialist. Please follow up with your specialist as needed.  We recommend the following healthy lifestyle measures: - eat a healthy whole foods diet consisting of regular small meals composed of vegetables, fruits, beans, nuts, seeds, healthy meats such as white chicken and fish and whole grains.  - avoid sweets, white starchy foods, fried foods, fast food, processed foods, sodas, red meet and other fattening foods.  - get a least 150-300 minutes of aerobic exercise per week.   -We have ordered labs or studies at this visit. It can take up to 1-2 weeks for results and processing. We will contact you with instructions IF your results are abnormal. Normal results will be released to your [facility]. If you have not heard from [place] or can not find your results in [facility] in 2 weeks please contact our office.           No Follow-up on file.  Brianna [name],  Moriah Shawley R.

## 2015-07-06 NOTE — Progress Notes (Signed)
Pre visit review using our clinic review tool, if applicable. No additional management support is needed unless otherwise documented below in the visit note. 

## 2015-07-26 ENCOUNTER — Other Ambulatory Visit: Payer: Self-pay | Admitting: *Deleted

## 2015-09-03 ENCOUNTER — Other Ambulatory Visit: Payer: Self-pay | Admitting: Allergy and Immunology

## 2015-09-13 ENCOUNTER — Encounter: Payer: Self-pay | Admitting: Family Medicine

## 2015-09-13 ENCOUNTER — Ambulatory Visit (INDEPENDENT_AMBULATORY_CARE_PROVIDER_SITE_OTHER): Payer: PRIVATE HEALTH INSURANCE | Admitting: Family Medicine

## 2015-09-13 VITALS — BP 118/80 | HR 88 | Temp 97.9°F | Ht 68.0 in | Wt 240.2 lb

## 2015-09-13 DIAGNOSIS — M62838 Other muscle spasm: Secondary | ICD-10-CM

## 2015-09-13 DIAGNOSIS — J069 Acute upper respiratory infection, unspecified: Secondary | ICD-10-CM

## 2015-09-13 DIAGNOSIS — M542 Cervicalgia: Secondary | ICD-10-CM | POA: Diagnosis not present

## 2015-09-13 MED ORDER — CYCLOBENZAPRINE HCL 5 MG PO TABS: 5.0000 mg | ORAL_TABLET | Freq: Three times a day (TID) | ORAL | Status: DC | PRN

## 2015-09-13 NOTE — Progress Notes (Signed)
Pre visit review using our clinic review tool, if applicable. No additional management support is needed unless otherwise documented below in the visit note. 

## 2015-09-13 NOTE — Patient Instructions (Signed)
Before you leave: Neck spasm exercises Follow-up in 4 weeks for the neck pain  For the neck: -Do the exercises provided 4 days per week -Flexeril at night for 5-7 days then as needed per instructions -heat 15 minutes twice daily -Tylenol or Aleve per instructions and topical sports creams as needed for pain, these are available over-the-counter  INSTRUCTIONS FOR UPPER RESPIRATORY INFECTION:  -plenty of rest and fluids  -nasal saline wash 2-3 times daily (use prepackaged nasal saline or bottled/distilled water if making your own)   -can use AFRIN nasal spray for drainage and nasal congestion - but do NOT use longer then 3-4 days  -can use tylenol (in no history of liver disease) or ibuprofen (if no history of kidney disease, bowel bleeding or significant heart disease) as directed for aches and sorethroat  -in the winter time, using a humidifier at night is helpful (please follow cleaning instructions)  -if you are taking a cough medication - use only as directed, may also try a teaspoon of honey to coat the throat and throat lozenges.   -for sore throat, salt water gargles can help  -follow up if you have fevers, facial pain, tooth pain, difficulty breathing or are worsening or symptoms persist longer then expected  Upper Respiratory Infection, Adult An upper respiratory infection (URI) is also known as the common cold. It is often caused by a type of germ (virus). Colds are easily spread (contagious). You can pass it to others by kissing, coughing, sneezing, or drinking out of the same glass. Usually, you get better in 1 to 3  weeks.  However, the cough can last for even longer. HOME CARE   Only take medicine as told by your doctor. Follow instructions provided above.  Drink enough water and fluids to keep your pee (urine) clear or pale yellow.  Get plenty of rest.  Return to work when your temperature is < 100 for 24 hours or as told by your doctor. You may use a face mask and  wash your hands to stop your cold from spreading. GET HELP RIGHT AWAY IF:   After the first few days, you feel you are getting worse.  You have questions about your medicine.  You have chills, shortness of breath, or red spit (mucus).  You have pain in the face for more then 1-2 days, especially when you bend forward.  You have a fever, puffy (swollen) neck, pain when you swallow, or white spots in the back of your throat.  You have a bad headache, ear pain, sinus pain, or chest pain.  You have a high-pitched whistling sound when you breathe in and out (wheezing).  You cough up blood.  You have sore muscles or a stiff neck. MAKE SURE YOU:   Understand these instructions.  Will watch your condition.  Will get help right away if you are not doing well or get worse. Document Released: 09/10/2007 Document Revised: 06/16/2011 Document Reviewed: 06/29/2013 [facility] Patient Information 2015 [employer] [place]. This information is not intended to replace advice given to you by your health care provider. Make sure you discuss any questions you have with your health care provider.

## 2015-09-13 NOTE — Progress Notes (Signed)
HPI:  Acute visit for 2 issues:  URI: -started: 4 days ago -symptoms:nasal congestion, sore throat, cough -denies:fever, SOB, NVD, tooth pain -has tried: her regular allergy medications -sick contacts/travel/risks: no reported flu, strep or tick exposure -Hx of: allergies  L sided neck pain: -started 1 week ago after yard work -Symptoms include sore muscles in the left side of the neck and trapezius muscle region, mild to moderate pain, worse with certain movements -Denies radiation, weakness, numbness, headache or worsening  ROS: See pertinent positives and negatives per HPI.  Past Medical History  Diagnosis Date  . Thyroid disease     hypothyroidism  . Infertility, female   . Allergy     allergic rhinitis-- Dr Willa RoughHicks, allergist  . Hypertension     Past Surgical History  Procedure Laterality Date  . Tubal ligation      Family History  Problem Relation Age of Onset  . Hypertension Mother   . Hyperlipidemia Father   . Hypertension Father   . Stroke Father   . Arthritis Other     Social History   Social History  . Marital Status: Single    Spouse Name: N/A  . Number of Children: N/A  . Years of Education: N/A   Social History Main Topics  . Smoking status: Former Smoker -- 5 years    Types: Cigarettes    Quit date: 04/07/1990  . Smokeless tobacco: None     Comment: light smoker  . Alcohol Use: No  . Drug Use: No  . Sexual Activity: Not Asked   Other Topics Concern  . 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  albuterol (VENTOLIN HFA) 108 (90 BASE) MCG/ACT inhaler, Inhale 2 puffs into the lungs daily as needed.  , Disp: , Rfl:  .  ciclesonide (ALVESCO) 160 MCG/ACT inhaler, Inhale 1 puff into the lungs 2 (two) times daily. Per Dr Willa RoughHicks, Disp: ,  Rfl:  .  desloratadine (CLARINEX) 5 MG tablet, TAKE 1 TABLET (5 MG TOTAL) BY MOUTH DAILY., Disp: 30 tablet, Rfl: 0 .  fluticasone (FLOVENT HFA) 110 MCG/ACT inhaler, Inhale 2 puffs into the lungs 2 (two) times daily., Disp: 1 Inhaler, Rfl: 3 .  levocetirizine (XYZAL) 5 MG tablet, Take 5 mg by mouth every evening., Disp: , Rfl:  .  levothyroxine (SYNTHROID, LEVOTHROID) 100 MCG tablet, TAKE 1 TABLET (100 MCG TOTAL) BY MOUTH DAILY., Disp: 90 tablet, Rfl: 2 .  SINGULAIR 10 MG tablet, TAKE ONE TABLET EACH EVENING TO PREVENT COUGH OR WHEEZE., Disp: 30 tablet, Rfl: 3 .  valsartan-hydrochlorothiazide (DIOVAN HCT) 160-12.5 MG per tablet, Take 1 tablet by mouth daily., Disp: 90 tablet, Rfl: 3 .  cyclobenzaprine (FLEXERIL) 5 MG tablet, Take 1 tablet (5 mg total) by mouth 3 (three) times daily as needed for muscle spasms., Disp: 20 tablet, Rfl: 0  EXAM:  Filed Vitals:   09/13/15 1442  BP: 118/80  Pulse: 88  Temp: 97.9 F (36.6 C)    Body mass index is 36.53 kg/(m^2).  GENERAL: vitals reviewed and listed above, alert, oriented, appears well hydrated and in no acute distress  HEENT: atraumatic, conjunttiva clear, no obvious abnormalities on inspection of external nose and ears,  normal appearance of ear canals and TMs, clear nasal congestion, mild post oropharyngeal erythema with PND, no tonsillar edema or exudate, no sinus TTP  NECK: no obvious masses on inspection  LUNGS: clear to auscultation bilaterally, no wheezes, rales or rhonchi, good air movement  CV: HRRR, no peripheral edema  MS: moves all extremities without noticeable abnormality; normal inspection of the head and neck except for slightly head forward and shoulders forward posture, normal range of motion of the head and neck, negative Spurling, tenderness to palpation in the trapezius muscle on the left with muscle spasm, no bony tenderness to palpation, normal strength/sensitivity to light touch/DTRs in the upper extremities  bilaterally  PSYCH: pleasant and cooperative, no obvious depression or anxiety  ASSESSMENT AND PLAN:  Discussed the following assessment and plan:  Acute upper respiratory infection -given HPI and exam findings today, a serious infection or illness is unlikely. We discussed potential etiologies, with VURI being most likely, and advised supportive care and monitoring. We discussed treatment side effects, likely course, antibiotic misuse, transmission, and signs of developing a serious illness.  Muscle spasm Neck pain -we discussed possible serious and likely etiologies, workup and treatment, treatment risks and return precautions; likely muscle spasm -after this discussion, Baelyn opted for conservative symptomatic treatment, home exercise program and follow up in 4 weeks, the patient instructions   -of course, we advised to return or notify a doctor immediately if symptoms worsen or persist or new concerns arise.    Patient Instructions  Before you leave: Neck spasm exercises Follow-up in 4 weeks for the neck pain  For the neck: -Do the exercises provided 4 days per week -Flexeril at night for 5-7 days then as needed per instructions -heat 15 minutes twice daily -Tylenol or Aleve per instructions and topical sports creams as needed for pain, these are available over-the-counter  INSTRUCTIONS FOR UPPER RESPIRATORY INFECTION:  -plenty of rest and fluids  -nasal saline wash 2-3 times daily (use prepackaged nasal saline or bottled/distilled water if making your own)   -can use AFRIN nasal spray for drainage and nasal congestion - but do NOT use longer then 3-4 days  -can use tylenol (in no history of liver disease) or ibuprofen (if no history of kidney disease, bowel bleeding or significant heart disease) as directed for aches and sorethroat  -in the winter time, using a humidifier at night is helpful (please follow cleaning instructions)  -if you are taking a cough medication -  use only as directed, may also try a teaspoon of honey to coat the throat and throat lozenges.   -for sore throat, salt water gargles can help  -follow up if you have fevers, facial pain, tooth pain, difficulty breathing or are worsening or symptoms persist longer then expected  Upper Respiratory Infection, Adult An upper respiratory infection (URI) is also known as the common cold. It is often caused by a type of germ (virus). Colds are easily spread (contagious). You can pass it to others by kissing, coughing, sneezing, or drinking out of the same glass. Usually, you get better in 1 to 3  weeks.  However, the cough can last for even longer. HOME CARE   Only take medicine as told by your doctor. Follow instructions provided above.  Drink enough water and fluids to keep your pee (urine) clear or pale yellow.  Get plenty of rest.  Return to work when your temperature is < 100 for 24 hours or as told by your doctor. You may use  a face mask and wash your hands to stop your cold from spreading. GET HELP RIGHT AWAY IF:   After the first few days, you feel you are getting worse.  You have questions about your medicine.  You have chills, shortness of breath, or red spit (mucus).  You have pain in the face for more then 1-2 days, especially when you bend forward.  You have a fever, puffy (swollen) neck, pain when you swallow, or white spots in the back of your throat.  You have a bad headache, ear pain, sinus pain, or chest pain.  You have a high-pitched whistling sound when you breathe in and out (wheezing).  You cough up blood.  You have sore muscles or a stiff neck. MAKE SURE YOU:   Understand these instructions.  Will watch your condition.  Will get help right away if you are not doing well or get worse. Document Released: 09/10/2007 Document Revised: 06/16/2011 Document Reviewed: 06/29/2013 [facility] Patient Information 2015 [place], [place]. This information is not intended to  replace advice given to you by your health care provider. Make sure you discuss any questions you have with your health care provider.       Kriste Basque R.

## 2015-10-01 ENCOUNTER — Other Ambulatory Visit: Payer: Self-pay | Admitting: Allergy and Immunology

## 2015-10-04 ENCOUNTER — Ambulatory Visit: Payer: PRIVATE HEALTH INSURANCE | Admitting: Allergy and Immunology

## 2015-10-04 ENCOUNTER — Other Ambulatory Visit: Payer: Self-pay | Admitting: Allergy and Immunology

## 2015-10-29 ENCOUNTER — Ambulatory Visit: Payer: PRIVATE HEALTH INSURANCE | Admitting: Family Medicine

## 2015-10-29 DIAGNOSIS — Z0289 Encounter for other administrative examinations: Secondary | ICD-10-CM

## 2015-10-29 NOTE — Progress Notes (Deleted)
HPI:  HTN: -meds: valsartan -hctz 25 -denies: CP, SOB, DOE, HA, vision changes -hx LE edema  Hypothyroidism: -meds: synthroid daily -denies: skin changes, weight changes  Obesity/Prediabetes: -regular exercise: walking now, using fit bit, improving diet -diet is not great -denies: vision, fatigue, polyuria, polydipsia  Seasonal Allergies: -sees Dr. Willa Rough -denies asthma, but on inhalers  ROS: See pertinent positives and negatives per HPI.  Past Medical History:  Diagnosis Date  . Allergy    allergic rhinitis-- Dr Willa Rough, allergist  . Hypertension   . Infertility, female   . Thyroid disease    hypothyroidism    Past Surgical History:  Procedure Laterality Date  . TUBAL LIGATION      Family History  Problem Relation Age of Onset  . Hypertension Mother   . Hyperlipidemia Father   . Hypertension Father   . Stroke Father   . Arthritis Other     Social History   Social History  . Marital status: Single    Spouse name: N/A  . Number of children: N/A  . Years of education: N/A   Social History Main Topics  . Smoking status: Former Smoker    Years: 5.00    Types: Cigarettes    Quit date: 04/07/1990  . Smokeless tobacco: Not on file     Comment: light smoker  . Alcohol use No  . Drug use: No  . Sexual activity: Not on file   Other Topics Concern  . 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  albuterol (VENTOLIN HFA) 108 (90 BASE) MCG/ACT inhaler, Inhale 2 puffs into the lungs daily as needed.  , Disp: , Rfl:  .  ciclesonide (ALVESCO) 160 MCG/ACT inhaler, Inhale 1 puff into the lungs 2 (two) times daily. Per Dr Willa Rough, Disp: , Rfl:  .  cyclobenzaprine (FLEXERIL) 5 MG tablet, Take 1 tablet (5 mg total) by mouth 3 (three) times daily  as needed for muscle spasms., Disp: 20 tablet, Rfl: 0 .  desloratadine (CLARINEX) 5 MG tablet, TAKE 1 TABLET (5 MG TOTAL) BY MOUTH DAILY., Disp: 30 tablet, Rfl: 0 .  fluticasone (FLOVENT HFA) 110 MCG/ACT inhaler, Inhale 2 puffs into the lungs 2 (two) times daily., Disp: 1 Inhaler, Rfl: 3 .  levocetirizine (XYZAL) 5 MG tablet, Take 5 mg by mouth every evening., Disp: , Rfl:  .  levothyroxine (SYNTHROID, LEVOTHROID) 100 MCG tablet, TAKE 1 TABLET (100 MCG TOTAL) BY MOUTH DAILY., Disp: 90 tablet, Rfl: 2 .  SINGULAIR 10 MG tablet, TAKE ONE TABLET EACH EVENING TO PREVENT COUGH OR WHEEZE., Disp: 30 tablet, Rfl: 3 .  valsartan-hydrochlorothiazide (DIOVAN HCT) 160-12.5 MG per tablet, Take 1 tablet by mouth daily., Disp: 90 tablet, Rfl: 3  EXAM:  There were no vitals filed for this visit.  There is no height or weight on file to calculate BMI.  GENERAL: vitals reviewed and listed above, alert, oriented, appears well hydrated and in no acute distress  HEENT: atraumatic, conjunttiva clear, no obvious abnormalities on inspection of external nose and ears  NECK: no obvious masses on inspection  LUNGS: clear to auscultation bilaterally, no wheezes, rales or rhonchi, good air movement  CV: HRRR, no peripheral edema  MS: moves all extremities without noticeable abnormality  PSYCH: pleasant and cooperative, no obvious depression or anxiety  ASSESSMENT AND PLAN:  Discussed the following assessment and plan:  No diagnosis found.  -Patient advised to return or notify a doctor immediately if symptoms worsen or persist or new concerns arise.  There are no Patient Instructions on file for this visit.  Kriste Basque R., DO

## 2015-10-31 ENCOUNTER — Other Ambulatory Visit: Payer: Self-pay | Admitting: Family Medicine

## 2015-10-31 DIAGNOSIS — I10 Essential (primary) hypertension: Secondary | ICD-10-CM

## 2016-01-03 NOTE — Progress Notes (Signed)
HPI:  HTN: -meds: valsartan 160mg -hctz 25 -denies: CP, SOB, DOE, HA, vision changes -hx LE edema  Hypothyroidism: -meds: synthroid daily -denies: skin changes, weight changes  Obesity/Prediabetes: -regular exercise: walking now, using fit bit to goal of 10,000 steps per day-diet is not great -denies: vision, fatigue, polyuria, polydipsia  Seasonal Allergies: -sees Dr. Willa Rough -denies asthma, but on inhalers   ROS: See pertinent positives and negatives per HPI.  Past Medical History:  Diagnosis Date  . Allergy    allergic rhinitis-- Dr Willa Rough, allergist  . Hypertension   . Infertility, female   . Thyroid disease    hypothyroidism    Past Surgical History:  Procedure Laterality Date  . TUBAL LIGATION      Family History  Problem Relation Age of Onset  . Hypertension Mother   . Hyperlipidemia Father   . Hypertension Father   . Stroke Father   . Arthritis Other     Social History   Social History  . Marital status: Single    Spouse name: N/A  . Number of children: N/A  . Years of education: N/A   Social History Main Topics  . Smoking status: Former Smoker    Years: 5.00    Types: Cigarettes    Quit date: 04/07/1990  . Smokeless tobacco: None     Comment: light smoker  . Alcohol use No  . Drug use: No  . Sexual activity: Not Asked   Other Topics Concern  . 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  albuterol (VENTOLIN HFA) 108 (90 BASE) MCG/ACT inhaler, Inhale 2 puffs into the lungs daily as needed.  , Disp: , Rfl:  .  ciclesonide (ALVESCO) 160 MCG/ACT inhaler, Inhale 1 puff into the lungs 2 (two) times daily. Per Dr Willa Rough, Disp: , Rfl:  .  cyclobenzaprine (FLEXERIL) 5 MG tablet, Take 1 tablet (5 mg total) by mouth 3 (three) times  daily as needed for muscle spasms., Disp: 20 tablet, Rfl: 0 .  desloratadine (CLARINEX) 5 MG tablet, TAKE 1 TABLET (5 MG TOTAL) BY MOUTH DAILY., Disp: 30 tablet, Rfl: 0 .  fluticasone (FLOVENT HFA) 110 MCG/ACT inhaler, Inhale 2 puffs into the lungs 2 (two) times daily., Disp: 1 Inhaler, Rfl: 3 .  levocetirizine (XYZAL) 5 MG tablet, Take 5 mg by mouth every evening., Disp: , Rfl:  .  levothyroxine (SYNTHROID, LEVOTHROID) 100 MCG tablet, TAKE 1 TABLET (100 MCG TOTAL) BY MOUTH DAILY., Disp: 90 tablet, Rfl: 2 .  SINGULAIR 10 MG tablet, TAKE ONE TABLET EACH EVENING TO PREVENT COUGH OR WHEEZE., Disp: 30 tablet, Rfl: 3 .  valsartan-hydrochlorothiazide (DIOVAN-HCT) 160-12.5 MG tablet, TAKE ONE TABLET BY MOUTH ONE TIME DAILY, Disp: 90 tablet, Rfl: 3  EXAM:  Vitals:   01/04/16 0814  BP: 120/82  Pulse: 86  Temp: 98 F (36.7 C)    Body mass index is 36.49 kg/m.  GENERAL: vitals reviewed and listed above, alert, oriented, appears well hydrated and in no acute distress  HEENT: atraumatic, conjunttiva clear, no obvious abnormalities on inspection of external nose and ears  NECK: no obvious masses on inspection  LUNGS: clear to auscultation bilaterally, no wheezes, rales or rhonchi, good air movement  CV: HRRR, no peripheral edema  MS: moves all extremities without noticeable abnormality  PSYCH: pleasant and cooperative, no obvious depression or anxiety  ASSESSMENT AND PLAN:  Discussed the following assessment and plan:  Essential hypertension - Plan: Basic metabolic panel, CBC (no diff)  Obesity  Hypothyroidism, unspecified hypothyroidism type - Plan: TSH  -spent along time talking about lifestyle - particularly the diet portion - med diet advised -cbc, bmp, tsh; not fasting so will do cholesterol at physical -? Flu shot - declined, she wants to do at work -cpe march -Patient advised to return or notify a doctor immediately if symptoms worsen or persist or new concerns  arise.  Patient Instructions  BEFORE YOU LEAVE: -follow up: physical in march; come fasting if possible -labs  We have ordered labs or studies at this visit. It can take up to 1-2 weeks for results and processing. IF results require follow up or explanation, we will call you with instructions. Clinically stable results will be released to [facility]. If you have not heard from us or cannot find your results in [facility] in 2 weeks please contact our office at [phone].  If you are not yet signed up for [facility], please consider signing up.   We recommend the following healthy lifestyle for LIFE: 1) Small portions.   Tip: eat off of a salad plate instead of a dinner plate.  Tip: It is ok to feel hungry after a meal - that likely means you ate an appropriate portion.  Tip: if you need more or a snack choose fruits, veggies and/or a handful of nuts or seeds.  2) Eat a healthy clean diet.  * Tip: Avoid (less then 1 serving per week): processed foods, sweets, sweetened drinks, white starches (rice, flour, bread, potatoes, pasta, etc), red meat, fast foods, butter  *Tip: CHOOSE instead   * 5-9 servings per day of fresh or frozen fruits and vegetables (but not corn, potatoes, bananas, canned or dried fruit)   *nuts and seeds, beans   *olives and olive oil   *small portions of lean meats such as fish and white chicken    *small portions of whole grains  3)Get at least 150 minutes of sweaty aerobic exercise per week.  4)Reduce stress - consider counseling, meditation and relaxation to balance other aspects of your life.          Kriste BasqueKIM, Ephrem Carrick R., DO

## 2016-01-04 ENCOUNTER — Ambulatory Visit (INDEPENDENT_AMBULATORY_CARE_PROVIDER_SITE_OTHER): Payer: PRIVATE HEALTH INSURANCE | Admitting: Family Medicine

## 2016-01-04 ENCOUNTER — Encounter: Payer: Self-pay | Admitting: Family Medicine

## 2016-01-04 VITALS — BP 120/82 | HR 86 | Temp 98.0°F | Ht 68.0 in | Wt 240.0 lb

## 2016-01-04 DIAGNOSIS — I10 Essential (primary) hypertension: Secondary | ICD-10-CM

## 2016-01-04 DIAGNOSIS — E669 Obesity, unspecified: Secondary | ICD-10-CM

## 2016-01-04 DIAGNOSIS — E039 Hypothyroidism, unspecified: Secondary | ICD-10-CM

## 2016-01-04 LAB — CBC
HEMATOCRIT: 40.3 % (ref 36.0–46.0)
HEMOGLOBIN: 13.6 g/dL (ref 12.0–15.0)
MCHC: 33.7 g/dL (ref 30.0–36.0)
MCV: 91.1 fl (ref 78.0–100.0)
PLATELETS: 326 10*3/uL (ref 150.0–400.0)
RBC: 4.42 Mil/uL (ref 3.87–5.11)
RDW: 13.2 % (ref 11.5–15.5)
WBC: 5.4 10*3/uL (ref 4.0–10.5)

## 2016-01-04 LAB — TSH: TSH: 1.92 u[IU]/mL (ref 0.35–4.50)

## 2016-01-04 LAB — BASIC METABOLIC PANEL
BUN: 10 mg/dL (ref 6–23)
CHLORIDE: 103 meq/L (ref 96–112)
CO2: 29 mEq/L (ref 19–32)
Calcium: 9.1 mg/dL (ref 8.4–10.5)
Creatinine, Ser: 0.73 mg/dL (ref 0.40–1.20)
GFR: 107.24 mL/min (ref >60.00)
Glucose, Bld: 86 mg/dL (ref 70–99)
POTASSIUM: 4.6 meq/L (ref 3.5–5.1)
Sodium: 140 mEq/L (ref 135–145)

## 2016-01-04 NOTE — Progress Notes (Signed)
Pre visit review using our clinic review tool, if applicable. No additional management support is needed unless otherwise documented below in the visit note. 

## 2016-01-04 NOTE — Patient Instructions (Signed)
BEFORE YOU LEAVE: -follow up: physical in march; come fasting if possible -labs  We have ordered labs or studies at this visit. It can take up to 1-2 weeks for results and processing. IF results require follow up or explanation, we will call you with instructions. Clinically stable results will be released to your [facility]. If you have not heard from us or cannot find your results in [facility] in 2 weeks please contact our office at [phone].  If you are not yet signed up for [facility], please consider signing up.   We recommend the following healthy lifestyle for LIFE: 1) Small portions.   Tip: eat off of a salad plate instead of a dinner plate.  Tip: It is ok to feel hungry after a meal - that likely means you ate an appropriate portion.  Tip: if you need more or a snack choose fruits, veggies and/or a handful of nuts or seeds.  2) Eat a healthy clean diet.  * Tip: Avoid (less then 1 serving per week): processed foods, sweets, sweetened drinks, white starches (rice, flour, bread, potatoes, pasta, etc), red meat, fast foods, butter  *Tip: CHOOSE instead   * 5-9 servings per day of fresh or frozen fruits and vegetables (but not corn, potatoes, bananas, canned or dried fruit)   *nuts and seeds, beans   *olives and olive oil   *small portions of lean meats such as fish and white chicken    *small portions of whole grains  3)Get at least 150 minutes of sweaty aerobic exercise per week.  4)Reduce stress - consider counseling, meditation and relaxation to balance other aspects of your life.

## 2016-07-02 NOTE — Progress Notes (Signed)
HPI:  Here for CPE:  -Concerns and/or follow up today:  PMH significant for HTN, Obesity, Asthma, Hyperglycemia, Hypothyroidism, Allergies. Due for mammogram, labs, hep c screen. Refuses hep c screening. Reports sees [facility] gyn for paps, breast exams and women's health and is scheduled for next week. She has occ abd discomfort a few times per year with gas, increased frequency of stools - this resolves with changes to diet. Does not want to pursue any evaluation for this.  -Diet: variety of foods, balance and well rounded, larger portion sizes  -Exercise: no regular exercise  -Taking folic acid, vitamin D or calcium: yes  -Diabetes and Dyslipidemia Screening: due for bloodwork today  -Vaccines: UTD  -pap history: sees gyn  -sexual activity: yes, female partner, no new partners  -wants STI testing (Hep C if born [id number]): no, declines  -FH breast, colon or ovarian ca: see FH Last mammogram: sees gyn for womens/breast health Last colon cancer screening: per her report normal colonosocpy at age 89  -Alcohol, Tobacco, drug use: see social history  Review of Systems - no fevers, unintentional weight loss, vision loss, hearing loss, chest pain, sob, hemoptysis, melena, hematochezia, hematuria, genital discharge, changing or concerning skin lesions, bleeding, bruising, loc, thoughts of self harm or SI  Past Medical History:  Diagnosis Date  . Allergy    allergic rhinitis-- Dr Willa RoughHicks, allergist  . Hypertension   . Infertility, female   . Thyroid disease    hypothyroidism    Past Surgical History:  Procedure Laterality Date  . TUBAL LIGATION      Family History  Problem Relation Age of Onset  . Hypertension Mother   . Hyperlipidemia Father   . Hypertension Father   . Stroke Father   . Arthritis Other     Social History   Social History  . Marital status: Single    Spouse name: N/A  . Number of children: N/A  . Years of education: N/A   Social History Main  Topics  . Smoking status: Former Smoker    Years: 5.00    Types: Cigarettes    Quit date: 04/07/1990  . Smokeless tobacco: Never Used     Comment: light smoker  . Alcohol use No  . Drug use: No  . Sexual activity: Not Asked   Other Topics Concern  . 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  albuterol (VENTOLIN HFA) 108 (90 BASE) MCG/ACT inhaler, Inhale 2 puffs into the lungs daily as needed.  , Disp: , Rfl:  .  ciclesonide (ALVESCO) 160 MCG/ACT inhaler, Inhale 1 puff into the lungs 2 (two) times daily. Per Dr Willa RoughHicks, Disp: , Rfl:  .  cyclobenzaprine (FLEXERIL) 5 MG tablet, Take 1 tablet (5 mg total) by mouth 3 (three) times daily as needed for muscle spasms., Disp: 20 tablet, Rfl: 0 .  desloratadine (CLARINEX) 5 MG tablet, TAKE 1 TABLET (5 MG TOTAL) BY MOUTH DAILY., Disp: 30 tablet, Rfl: 0 .  fluticasone (FLOVENT HFA) 110 MCG/ACT inhaler, Inhale 2 puffs into the lungs 2 (two) times daily., Disp: 1 Inhaler, Rfl: 3 .  levocetirizine (XYZAL) 5 MG tablet, Take 5 mg by mouth every evening., Disp: , Rfl:  .  levothyroxine (SYNTHROID, LEVOTHROID) 100 MCG tablet, TAKE 1 TABLET (100 MCG TOTAL) BY MOUTH DAILY., Disp: 90 tablet, Rfl: 2 .  SINGULAIR 10 MG tablet, TAKE ONE TABLET EACH EVENING TO PREVENT COUGH OR WHEEZE., Disp: 30 tablet, Rfl: 3 .  valsartan-hydrochlorothiazide (DIOVAN-HCT) 160-12.5 MG tablet, TAKE ONE TABLET BY MOUTH ONE TIME DAILY, Disp: 90 tablet, Rfl: 3  EXAM:  Vitals:   07/03/16 0719  BP: 112/76  Pulse: 87  Temp: 98 F (36.7 C)  Body mass index is 37.62 kg/m.   GENERAL: vitals reviewed and listed below, alert, oriented, appears well hydrated and in no acute distress  HEENT: head atraumatic, PERRLA, normal appearance of eyes, ears, nose and mouth. moist mucus  membranes.  NECK: supple, no masses or lymphadenopathy  LUNGS: clear to auscultation bilaterally, no rales, rhonchi or wheeze  CV: HRRR, no peripheral edema or cyanosis, normal pedal pulses  ABDOMEN: bowel sounds normal, soft, non tender to palpation, no masses, no rebound or guarding  SKIN: no rash or abnormal lesions  GU/BREAST: declined, does with gyn  MS: normal gait, moves all extremities normally  NEURO: normal gait, speech and thought processing grossly intact, muscle tone grossly intact throughout  PSYCH: normal affect, pleasant and cooperative  ASSESSMENT AND PLAN:  Discussed the following assessment and plan:  Encounter for preventive health examination  Class 2 obesity due to excess calories with serious comorbidity and body mass index (BMI) of 37.0 to 37.9 in adult - Plan: Hemoglobin A1c, Lipid panel  Essential hypertension - Plan: Basic metabolic panel, CBC  Hypothyroidism, unspecified type - Plan: TSH   -Discussed and advised all [place] preventive services health task force level A and B recommendations for age, sex and risks.  -Advised at least 150 minutes of exercise per week and a healthy diet with avoidance of (less then 1 serving per week) processed foods, white starches, red meat, fast foods and sweets and consisting of: * 5-9 servings of fresh fruits and vegetables (not corn or potatoes) *nuts and seeds, beans *olives and olive oil *lean meats such as fish and white chicken  *whole grains  -discussed causes of abd discomfort and options for evaluation, she declined and agrees to follow up if recurs or worsens.  -labs, studies and vaccines per orders this encounter  Orders Placed This Encounter  Procedures  . Basic metabolic panel  . CBC  . Hemoglobin A1c  . Lipid panel  . TSH    Patient advised to return to clinic immediately if symptoms worsen or persist or new concerns.  Patient Instructions  BEFORE YOU LEAVE: -follow up: 3-4  months -labs  We have ordered labs or studies at this visit. It can take up to 1-2 weeks for results and processing. IF results require follow up or explanation, we will call you with instructions. Clinically stable results will be released to your [facility]. If you have not heard from [place] or cannot find your results in [facility] in 2 weeks please contact our office at [phone].  If you are not yet signed up for [facility], please consider signing up.   We recommend the following healthy lifestyle for LIFE: 1) Small portions.   Tip: eat off of a salad plate instead of a dinner plate.  Tip: It is ok to feel hungry after a meal - that likely means you ate an appropriate portion.  Tip: if you need more or a snack choose fruits, veggies and/or a handful of nuts or seeds.  2) Eat a healthy clean  diet.  * Tip: Avoid (less then 1 serving per week): processed foods, sweets, sweetened drinks, white starches (rice, flour, bread, potatoes, pasta, etc), red meat, fast foods, butter  *Tip: CHOOSE instead   * 5-9 servings per day of fresh or frozen fruits and vegetables (but not corn, potatoes, bananas, canned or dried fruit)   *nuts and seeds, beans   *olives and olive oil   *small portions of lean meats such as fish and white chicken    *small portions of whole grains  3)Get at least 150 minutes of sweaty aerobic exercise per week.  4)Reduce stress - consider counseling, meditation and relaxation to balance other aspects of your life.  WE NOW OFFER   [facility] FAST TRACK!!!  SAME DAY Appointments for ACUTE CARE  Such as: Sprains, Injuries, cuts, abrasions, rashes, muscle pain, joint pain, back pain Colds, flu, sore throats, headache, allergies, cough, fever  Ear pain, sinus and eye infections Abdominal pain, nausea, vomiting, diarrhea, upset stomach Animal/insect bites  3 Easy Ways to Schedule: Walk-In Scheduling Call in scheduling Mychart Sign-up:  [email or web address]                 No Follow-up on file.  Kriste Basque R., DO

## 2016-07-03 ENCOUNTER — Encounter: Payer: Self-pay | Admitting: Family Medicine

## 2016-07-03 ENCOUNTER — Ambulatory Visit (INDEPENDENT_AMBULATORY_CARE_PROVIDER_SITE_OTHER): Payer: PRIVATE HEALTH INSURANCE | Admitting: Family Medicine

## 2016-07-03 VITALS — BP 112/76 | HR 87 | Temp 98.0°F | Ht 67.25 in | Wt 242.0 lb

## 2016-07-03 DIAGNOSIS — IMO0001 Reserved for inherently not codable concepts without codable children: Secondary | ICD-10-CM

## 2016-07-03 DIAGNOSIS — Z0000 Encounter for general adult medical examination without abnormal findings: Secondary | ICD-10-CM

## 2016-07-03 DIAGNOSIS — E039 Hypothyroidism, unspecified: Secondary | ICD-10-CM | POA: Diagnosis not present

## 2016-07-03 DIAGNOSIS — I10 Essential (primary) hypertension: Secondary | ICD-10-CM | POA: Diagnosis not present

## 2016-07-03 DIAGNOSIS — Z6837 Body mass index (BMI) 37.0-37.9, adult: Secondary | ICD-10-CM | POA: Diagnosis not present

## 2016-07-03 DIAGNOSIS — E6609 Other obesity due to excess calories: Secondary | ICD-10-CM

## 2016-07-03 LAB — BASIC METABOLIC PANEL
BUN: 8 mg/dL (ref 6–23)
CHLORIDE: 102 meq/L (ref 96–112)
CO2: 30 meq/L (ref 19–32)
CREATININE: 0.72 mg/dL (ref 0.40–1.20)
Calcium: 9.4 mg/dL (ref 8.4–10.5)
GFR: 108.76 mL/min (ref >60.00)
Glucose, Bld: 100 mg/dL — ABNORMAL HIGH (ref 70–99)
Potassium: 3.9 mEq/L (ref 3.5–5.1)
Sodium: 138 mEq/L (ref 135–145)

## 2016-07-03 LAB — TSH: TSH: 2.71 u[IU]/mL (ref 0.35–4.50)

## 2016-07-03 LAB — CBC
HEMATOCRIT: 41.2 % (ref 36.0–46.0)
HEMOGLOBIN: 14 g/dL (ref 12.0–15.0)
MCHC: 34 g/dL (ref 30.0–36.0)
MCV: 90.8 fl (ref 78.0–100.0)
Platelets: 323 10*3/uL (ref 150.0–400.0)
RBC: 4.54 Mil/uL (ref 3.87–5.11)
RDW: 13 % (ref 11.5–15.5)
WBC: 6 10*3/uL (ref 4.0–10.5)

## 2016-07-03 LAB — LIPID PANEL
Cholesterol: 156 mg/dL (ref 0–200)
HDL: 37.7 mg/dL — AB (ref >39.00)
LDL Cholesterol: 94 mg/dL (ref 0–99)
NONHDL: 117.87
Total CHOL/HDL Ratio: 4
Triglycerides: 121 mg/dL (ref 0.0–149.0)
VLDL: 24.2 mg/dL (ref 0.0–40.0)

## 2016-07-03 LAB — HEMOGLOBIN A1C: HEMOGLOBIN A1C: 5.9 % (ref 4.6–6.5)

## 2016-07-03 NOTE — Progress Notes (Signed)
Pre visit review using our clinic review tool, if applicable. No additional management support is needed unless otherwise documented below in the visit note. 

## 2016-07-03 NOTE — Patient Instructions (Addendum)
BEFORE YOU LEAVE: -follow up: 3-4 months -labs  We have ordered labs or studies at this visit. It can take up to 1-2 weeks for results and processing. IF results require follow up or explanation, we will call you with instructions. Clinically stable results will be released to your MYCHART. If you have not heard from us or cannot find your results in MYCHART in 2 weeks please contact our office at [phone].  If you are not yet signed up for MYCHART, please consider signing up.  We recommend the following healthy lifestyle for LIFE: 1) Small portions.   Tip: eat off of a salad plate instead of a dinner plate.  Tip: It is ok to feel hungry after a meal - that likely means you ate an appropriate portion.  Tip: if you need more or a snack choose fruits, veggies and/or a handful of nuts or seeds.  2) Eat a healthy clean diet.  * Tip: Avoid (less then 1 serving per week): processed foods, sweets, sweetened drinks, white starches (rice, flour, bread, potatoes, pasta, etc), red meat, fast foods, butter  *Tip: CHOOSE instead   * 5-9 servings per day of fresh or frozen fruits and vegetables (but not corn, potatoes, bananas, canned or dried fruit)   *nuts and seeds, beans   *olives and olive oil   *small portions of lean meats such as fish and white chicken    *small portions of whole grains  3)Get at least 150 minutes of sweaty aerobic exercise per week.  4)Reduce stress - consider counseling, meditation and relaxation to balance other aspects of your life.  WE NOW OFFER   [facility] FAST TRACK!!!  SAME DAY Appointments for ACUTE CARE  Such as: Sprains, Injuries, cuts, abrasions, rashes, muscle pain, joint pain, back pain Colds, flu, sore throats, headache, allergies, cough, fever  Ear pain, sinus and eye infections Abdominal pain, nausea, vomiting, diarrhea, upset stomach Animal/insect bites  3 Easy Ways to Schedule: Walk-In Scheduling Call in scheduling Mychart  Sign-up: [email or web address]         

## 2016-08-04 ENCOUNTER — Other Ambulatory Visit: Payer: Self-pay | Admitting: Family Medicine

## 2016-08-04 ENCOUNTER — Encounter: Payer: Self-pay | Admitting: Family Medicine

## 2016-09-11 ENCOUNTER — Other Ambulatory Visit: Payer: Self-pay | Admitting: Family Medicine

## 2016-09-11 DIAGNOSIS — I10 Essential (primary) hypertension: Secondary | ICD-10-CM

## 2016-09-24 ENCOUNTER — Other Ambulatory Visit (HOSPITAL_COMMUNITY): Payer: Self-pay | Admitting: Physical Medicine and Rehabilitation

## 2016-09-24 DIAGNOSIS — E041 Nontoxic single thyroid nodule: Secondary | ICD-10-CM

## 2016-09-30 ENCOUNTER — Ambulatory Visit (HOSPITAL_COMMUNITY)
Admission: RE | Admit: 2016-09-30 | Discharge: 2016-09-30 | Disposition: A | Discharge status: Home / Self Care | Payer: PRIVATE HEALTH INSURANCE | Source: Ambulatory Visit | Attending: Physical Medicine and Rehabilitation | Admitting: Physical Medicine and Rehabilitation

## 2016-09-30 DIAGNOSIS — E041 Nontoxic single thyroid nodule: Secondary | ICD-10-CM

## 2016-09-30 DIAGNOSIS — E039 Hypothyroidism, unspecified: Secondary | ICD-10-CM | POA: Diagnosis not present

## 2016-09-30 DIAGNOSIS — R760 Raised antibody titer: Secondary | ICD-10-CM | POA: Diagnosis not present

## 2016-12-12 NOTE — Progress Notes (Signed)
HPI:  Brianna [name] is a pleasant 78 ySHENA VINLUANr follow up. Chronic medical problems summarized below were reviewed for changes. Reports pharmacy told her she doe snot need to change her valsartan combo as it is not on recall list. Doing well. Exercising, eating healthier. Seeing Dr. Eduard Clos for her thyroid now - last check 4 months ago and dose reduced. Also seeing allergist. Denies CP, SOB, DOE, treatment intolerance or new symptoms. Declined flu shot and hep c screen.  HTN: -meds: valsartan-hctz  Obesity/HLD/Hyperglycemia: -no meds  Hypothyroidism: -meds: levothyroxine  Allergies/Asthma: -meds: singulair, xyzal, flonase - alb prn  ROS: See pertinent positives and negatives per HPI.  Past Medical History:  Diagnosis Date  . Allergy    allergic rhinitis-- Dr Willa Rough, allergist  . Hypertension   . Infertility, female   . Thyroid disease    hypothyroidism    Past Surgical History:  Procedure Laterality Date  . TUBAL LIGATION      Family History  Problem Relation Age of Onset  . Hypertension Mother   . Hyperlipidemia Father   . Hypertension Father   . Stroke Father   . Arthritis Other     Social History   Social History  . Marital status: Married    Spouse name: N/A  . Number of children: N/A  . Years of education: N/A   Social History Main Topics  . Smoking status: Former Smoker    Years: 5.00    Types: Cigarettes    Quit date: 04/07/1990  . Smokeless tobacco: Never Used     Comment: light smoker  . Alcohol use No  . Drug use: No  . Sexual activity: Not Asked   Other Topics Concern  . 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  albuterol (VENTOLIN HFA) 108 (90 BASE) MCG/ACT inhaler, Inhale 2 puffs into the lungs daily as needed.  , Disp:  , Rfl:  .  ciclesonide (ALVESCO) 160 MCG/ACT inhaler, Inhale 1 puff into the lungs 2 (two) times daily. Per Dr Willa Rough, Disp: , Rfl:  .  desloratadine (CLARINEX) 5 MG tablet, TAKE 1 TABLET (5 MG TOTAL) BY MOUTH DAILY., Disp: 30 tablet, Rfl: 0 .  fluticasone (FLOVENT HFA) 110 MCG/ACT inhaler, Inhale 2 puffs into the lungs 2 (two) times daily., Disp: 1 Inhaler, Rfl: 3 .  levocetirizine (XYZAL) 5 MG tablet, Take 5 mg by mouth every evening., Disp: , Rfl:  .  levothyroxine (SYNTHROID, LEVOTHROID) 75 MCG tablet, Take 75 mcg by mouth daily before breakfast., Disp: , Rfl:  .  SINGULAIR 10 MG tablet, TAKE ONE TABLET EACH EVENING TO PREVENT COUGH OR WHEEZE., Disp: 30 tablet, Rfl: 3 .  valsartan-hydrochlorothiazide (DIOVAN-HCT) 160-12.5 MG tablet, TAKE ONE TABLET BY MOUTH ONE TIME DAILY, Disp: 90 tablet, Rfl: 1  EXAM:  Vitals:   12/15/16 0817  BP: 102/78  Pulse: 93  Temp: 98.7 F (37.1 C)    Body mass index is 33.1 kg/m.  GENERAL: vitals reviewed and listed above, alert, oriented, appears well hydrated and in no acute distress  HEENT: atraumatic, conjunttiva clear, no obvious abnormalities on inspection of external nose and ears  NECK: no obvious masses on inspection  LUNGS: clear  to auscultation bilaterally, no wheezes, rales or rhonchi, good air movement  CV: HRRR, no peripheral edema  MS: moves all extremities without noticeable abnormality  PSYCH: pleasant and cooperative, no obvious depression or anxiety  ASSESSMENT AND PLAN:  Discussed the following assessment and plan:  Essential hypertension - Plan: Basic metabolic panel, CBC  Hyperlipidemia, unspecified hyperlipidemia type  Hyperglycemia - Plan: Hemoglobin A1c  BMI 33.0-33.9,adult  Hypothyroidism, unspecified type - Plan: TSH  -labs -lifestyle recs -will forward thyroid level per her request -continue current meds -Patient advised to return or notify a doctor immediately if symptoms worsen or persist or new concerns  arise.  Patient Instructions  BEFORE YOU LEAVE: -follow up: 3-4 months -labs  We have ordered labs or studies at this visit. It can take up to 1-2 weeks for results and processing. IF results require follow up or explanation, we will call you with instructions. Clinically stable results will be [facility]. If you have not heard from us or cannot find your results in [facility] in 2 weeks please contact our office at [phone].  If you are not yet signed up for [facility], please consider signing up.   We recommend the following healthy lifestyle for LIFE: 1) Small portions.   Tip: eat off of a salad plate instead of a dinner plate.  Tip: It is ok to feel hungry after a meal - that likely means you ate an appropriate portion.  Tip: if you need more or a snack choose fruits, veggies and/or a handful of nuts or seeds.  2) Eat a healthy clean diet.   TRY TO EAT: -at least 5-7 servings of low sugar vegetables per day (not corn, potatoes or bananas.) -berries are the best choice if you wish to eat fruit.   -lean meets (fish, chicken or [place] breasts) -vegan proteins for some meals - beans or tofu, whole grains, nuts and seeds -Replace bad fats with good fats - good fats include: fish, nuts and seeds, canola oil, olive oil -small amounts of low fat or non fat dairy -small amounts of100 % whole grains - check the lables  AVOID: -SUGAR, sweets, anything with added sugar, corn syrup or sweeteners -if you must have a sweetener, small amounts of stevia may be best -sweetened beverages -simple starches (rice, bread, potatoes, pasta, chips, etc - small amounts of 100% whole grains are ok) -red meat, pork, butter -fried foods, fast food, processed food, excessive dairy, eggs and coconut.  3)Get at least 150 minutes of sweaty aerobic exercise per week.  4)Reduce stress - consider counseling, meditation and relaxation to balance other aspects of your life.  WE NOW OFFER   [place]  [facility] FAST TRACK!!!  SAME DAY Appointments for ACUTE CARE  Such as: Sprains, Injuries, cuts, abrasions, rashes, muscle pain, joint pain, back pain Colds, flu, sore throats, headache, allergies, cough, fever  Ear pain, sinus and eye infections Abdominal pain, nausea, vomiting, diarrhea, upset stomach Animal/insect bites  3 Easy Ways to Schedule: Walk-In Scheduling Call in scheduling Mychart Sign-up: [email or web address]                 Kriste BasqueKIM, HANNAH R., DO

## 2016-12-15 ENCOUNTER — Ambulatory Visit (INDEPENDENT_AMBULATORY_CARE_PROVIDER_SITE_OTHER): Payer: PRIVATE HEALTH INSURANCE | Admitting: Family Medicine

## 2016-12-15 ENCOUNTER — Encounter: Payer: Self-pay | Admitting: Family Medicine

## 2016-12-15 VITALS — BP 102/78 | HR 93 | Temp 98.7°F | Ht 67.25 in | Wt 212.9 lb

## 2016-12-15 DIAGNOSIS — Z6833 Body mass index (BMI) 33.0-33.9, adult: Secondary | ICD-10-CM | POA: Diagnosis not present

## 2016-12-15 DIAGNOSIS — I10 Essential (primary) hypertension: Secondary | ICD-10-CM | POA: Diagnosis not present

## 2016-12-15 DIAGNOSIS — E039 Hypothyroidism, unspecified: Secondary | ICD-10-CM | POA: Diagnosis not present

## 2016-12-15 DIAGNOSIS — R739 Hyperglycemia, unspecified: Secondary | ICD-10-CM

## 2016-12-15 DIAGNOSIS — E785 Hyperlipidemia, unspecified: Secondary | ICD-10-CM

## 2016-12-15 LAB — CBC
HCT: 41.6 % (ref 36.0–46.0)
HEMOGLOBIN: 13.7 g/dL (ref 12.0–15.0)
MCHC: 32.9 g/dL (ref 30.0–36.0)
MCV: 90.6 fl (ref 78.0–100.0)
PLATELETS: 318 10*3/uL (ref 150.0–400.0)
RBC: 4.59 Mil/uL (ref 3.87–5.11)
RDW: 13.5 % (ref 11.5–15.5)
WBC: 5.4 10*3/uL (ref 4.0–10.5)

## 2016-12-15 LAB — BASIC METABOLIC PANEL
BUN: 8 mg/dL (ref 6–23)
CO2: 29 meq/L (ref 19–32)
Calcium: 9.6 mg/dL (ref 8.4–10.5)
Chloride: 101 mEq/L (ref 96–112)
Creatinine, Ser: 0.74 mg/dL (ref 0.40–1.20)
GFR: 105.19 mL/min (ref >60.00)
Glucose, Bld: 86 mg/dL (ref 70–99)
POTASSIUM: 4 meq/L (ref 3.5–5.1)
SODIUM: 141 meq/L (ref 135–145)

## 2016-12-15 LAB — TSH: TSH: 1.53 u[IU]/mL (ref 0.35–4.50)

## 2016-12-15 LAB — HEMOGLOBIN A1C: Hgb A1c MFr Bld: 5.6 % (ref 4.6–6.5)

## 2016-12-15 NOTE — Patient Instructions (Signed)
BEFORE YOU LEAVE: -follow up: 3-4 months -labs   We have ordered labs or studies at this visit. It can take up to 1-2 weeks for results and processing. IF results require follow up or explanation, we will call you with instructions. Clinically stable results will be released to your MYCHART. If you have not heard from us or cannot find your results in MYCHART in 2 weeks please contact our office at [phone].  If you are not yet signed up for MYCHART, please consider signing up.   We recommend the following healthy lifestyle for LIFE: 1) Small portions.   Tip: eat off of a salad plate instead of a dinner plate.  Tip: It is ok to feel hungry after a meal - that likely means you ate an appropriate portion.  Tip: if you need more or a snack choose fruits, veggies and/or a handful of nuts or seeds.  2) Eat a healthy clean diet.   TRY TO EAT: -at least 5-7 servings of low sugar vegetables per day (not corn, potatoes or bananas.) -berries are the best choice if you wish to eat fruit.   -lean meets (fish, chicken or turkey breasts) -vegan proteins for some meals - beans or tofu, whole grains, nuts and seeds -Replace bad fats with good fats - good fats include: fish, nuts and seeds, canola oil, olive oil -small amounts of low fat or non fat dairy -small amounts of100 % whole grains - check the lables  AVOID: -SUGAR, sweets, anything with added sugar, corn syrup or sweeteners -if you must have a sweetener, small amounts of stevia may be best -sweetened beverages -simple starches (rice, bread, potatoes, pasta, chips, etc - small amounts of 100% whole grains are ok) -red meat, pork, butter -fried foods, fast food, processed food, excessive dairy, eggs and coconut.  3)Get at least 150 minutes of sweaty aerobic exercise per week.  4)Reduce stress - consider counseling, meditation and relaxation to balance other aspects of your life.  WE NOW OFFER   [facility] [facility] FAST  TRACK!!!  SAME DAY Appointments for ACUTE CARE  Such as: Sprains, Injuries, cuts, abrasions, rashes, muscle pain, joint pain, back pain Colds, flu, sore throats, headache, allergies, cough, fever  Ear pain, sinus and eye infections Abdominal pain, nausea, vomiting, diarrhea, upset stomach Animal/insect bites  3 Easy Ways to Schedule: Walk-In Scheduling Call in scheduling Mychart Sign-up: [email or web address]              

## 2016-12-25 ENCOUNTER — Encounter: Payer: Self-pay | Admitting: Family Medicine

## 2017-03-14 ENCOUNTER — Other Ambulatory Visit: Payer: Self-pay | Admitting: Family Medicine

## 2017-03-14 DIAGNOSIS — I10 Essential (primary) hypertension: Secondary | ICD-10-CM

## 2017-03-23 ENCOUNTER — Ambulatory Visit: Payer: PRIVATE HEALTH INSURANCE | Admitting: Family Medicine

## 2017-03-23 ENCOUNTER — Encounter: Payer: Self-pay | Admitting: Family Medicine

## 2017-03-23 VITALS — BP 108/62 | HR 77 | Temp 98.2°F | Wt 208.5 lb

## 2017-03-23 DIAGNOSIS — H6983 Other specified disorders of Eustachian tube, bilateral: Secondary | ICD-10-CM

## 2017-03-23 DIAGNOSIS — R0982 Postnasal drip: Secondary | ICD-10-CM

## 2017-03-23 NOTE — Progress Notes (Signed)
Subjective:    Patient ID: Brianna [name], female    DOB: 1960-05-08, 56 y.o.   MRN: [id number]  No chief complaint on file.   HPI Patient was seen today for acute concern.  Ear ache (throbbing), sore throat, and slight HA x a few wks.  Cough and rhinorrhea started today.  Pt states the ear pain goes down into her throat, "like ears need to pop" and she hears an echo when talking.  Pt denies facial pressure or pain.  Pt has tried her regular meds including Azelastine nasal spray, xyzal, singulair.   Pt also feels like she needs to cough "something up but it is stuck".  Pt states Mucinex-D no longer works for her.  Past Medical History:  Diagnosis Date  . Allergy    allergic rhinitis-- Dr Willa RoughHicks, allergist  . Hypertension   . Infertility, female   . Thyroid disease    hypothyroidism    Allergies  Allergen Reactions  . Ciprofloxacin Other (See Comments)    Muscle pain  . Penicillins     REACTION: Rash  . Procaine Hcl     REACTION: Rash    ROS General: Denies fever, chills, night sweats, changes in weight, changes in appetite HEENT: Denies changes in vision + HA, ear ache, rhinorrhea, sore throat CV: Denies CP, palpitations, SOB, orthopnea Pulm: Denies SOB, wheezing   +cough GI: Denies abdominal pain, nausea, vomiting, diarrhea, constipation GU: Denies dysuria, hematuria, frequency, vaginal discharge Msk: Denies muscle cramps, joint pains Neuro: Denies weakness, numbness, tingling Skin: Denies rashes, bruising Psych: Denies depression, anxiety, hallucinations     Objective:    Blood pressure 108/62, pulse 77, temperature 98.2 F (36.8 C), temperature source Oral, weight 208 lb 8 oz (94.6 kg), SpO2 98 %.   Gen. Pleasant, well-nourished, in no distress, normal affect   HEENT: [place]/AT, face symmetric, no scleral icterus, PERRLA, nares patent without drainage, pharynx with post nasal drainage and mild erythema, no exudate.  TMs b/l full without effusion Neck: No JVD, cervical  lymphadenopathy with mild TTP. Lungs: no accessory muscle use, CTAB, no wheezes or rales Cardiovascular: RRR, no m/r/g, no peripheral edema Neuro:  A&Ox3, CN II-XII intact, normal gait Skin:  Warm, no lesions/ rash   Wt Readings from Last 3 Encounters:  03/23/17 208 lb 8 oz (94.6 kg)  12/15/16 212 lb 14.4 oz (96.6 kg)  07/03/16 242 lb (109.8 kg)    Assessment/Plan:  Dysfunction of both eustachian tubes -Maneuver performed to relieve pressure in Eustachian tubes. -Pt also taught how to massage side of face to help with discomfort -continue Azelastine and Xyzal -given handout  Post-nasal drainage -discussed symptoms a/w drainage -continue current medications -Reviewed proper nasal spray use.  F/u prn if symptoms continue or become worse in the next wk.

## 2017-03-23 NOTE — Patient Instructions (Addendum)
Eustachian Tube Dysfunction The eustachian tube connects the middle ear to the back of the nose. It regulates air pressure in the middle ear by allowing air to move between the ear and nose. It also helps to drain fluid from the middle ear space. When the eustachian tube does not function properly, air pressure, fluid, or both can build up in the middle ear. Eustachian tube dysfunction can affect one or both ears. What are the causes? This condition happens when the eustachian tube becomes blocked or cannot open normally. This may result from:  Ear infections.  Colds and other upper respiratory infections.  Allergies.  Irritation, such as from cigarette smoke or acid from the stomach coming up into the esophagus (gastroesophageal reflux).  Sudden changes in air pressure, such as from descending in an airplane.  Abnormal growths in the nose or throat, such as nasal polyps, tumors, or enlarged tissue at the back of the throat (adenoids).  What increases the risk? This condition may be more likely to develop in people who smoke and people who are overweight. Eustachian tube dysfunction may also be more likely to develop in children, especially children who have:  Certain birth defects of the mouth, such as cleft palate.  Large tonsils and adenoids.  What are the signs or symptoms? Symptoms of this condition may include:  A feeling of fullness in the ear.  Ear pain.  Clicking or popping noises in the ear.  Ringing in the ear.  Hearing loss.  Loss of balance.  Symptoms may get worse when the air pressure around you changes, such as when you travel to an area of high elevation or fly on an airplane. How is this diagnosed? This condition may be diagnosed based on:  Your symptoms.  A physical exam of your ear, nose, and throat.  Tests, such as those that measure: ? The movement of your eardrum (tympanogram). ? Your hearing (audiometry).  How is this treated? Treatment  depends on the cause and severity of your condition. If your symptoms are mild, you may be able to relieve your symptoms by moving air into ("popping") your ears. If you have symptoms of fluid in your ears, treatment may include:  Decongestants.  Antihistamines.  Nasal sprays or ear drops that contain medicines that reduce swelling (steroids).  In some cases, you may need to have a procedure to drain the fluid in your eardrum (myringotomy). In this procedure, a small tube is placed in the eardrum to:  Drain the fluid.  Restore the air in the middle ear space.  Follow these instructions at home:  Take over-the-counter and prescription medicines only as told by your health care provider.  Use techniques to help pop your ears as recommended by your health care provider. These may include: ? Chewing gum. ? Yawning. ? Frequent, forceful swallowing. ? Closing your mouth, holding your nose closed, and gently blowing as if you are trying to blow air out of your nose.  Do not do any of the following until your health care provider approves: ? Travel to high altitudes. ? Fly in airplanes. ? Work in a pressurized cabin or room. ? Scuba dive.  Keep your ears dry. Dry your ears completely after showering or bathing.  Do not smoke.  Keep all follow-up visits as told by your health care provider. This is important. Contact a health care provider if:  Your symptoms do not go away after treatment.  Your symptoms come back after treatment.  You are   unable to pop your ears.  You have: ? A fever. ? Pain in your ear. ? Pain in your head or neck. ? Fluid draining from your ear.  Your hearing suddenly changes.  You become very dizzy.  You lose your balance. This information is not intended to replace advice given to you by your health care provider. Make sure you discuss any questions you have with your health care provider. Document Released: 04/20/2015 Document Revised: 08/30/2015  Document Reviewed: 04/12/2014 Elsevier Interactive Patient Education  2018 [employer]vier Inc.  

## 2017-03-24 ENCOUNTER — Ambulatory Visit: Payer: PRIVATE HEALTH INSURANCE | Admitting: Family Medicine

## 2017-04-16 ENCOUNTER — Encounter: Payer: Self-pay | Admitting: Family Medicine

## 2017-06-11 NOTE — Progress Notes (Signed)
HPI:  Using dictation device. Unfortunately this device frequently misinterprets words/phrases.  Brianna [name] is a pleasant 57 y.o. here for follow up. Chronic medical problems summarized below were reviewed for changes and stability and were updated as needed below. These issues and their treatment remain stable for the most part.  She has had a little bit of clear vaginal discharge.  This started after taking an antibiotic for an ear infection.  She does not have a lot of itching, thick discharge, pelvic pain or any other symptoms.  She sees [facility] and is due for her annual exam.  She wonders if this is a yeast infection.  Denies concerns for STI, CP, SOB, DOE, treatment intolerance or new symptoms.    HTN: -meds: valsartan-hctz  Obesity/HLD/Hyperglycemia: -no meds  Hypothyroidism: -meds: levothyroxine 100mcg  Allergies/Asthma: -meds: singulair, xyzal, flonase - alb prn  ROS: See pertinent positives and negatives per HPI.  Past Medical History:  Diagnosis Date  . Allergy    allergic rhinitis-- Dr Willa RoughHicks, allergist  . Hypertension   . Infertility, female   . Thyroid disease    hypothyroidism    Past Surgical History:  Procedure Laterality Date  . TUBAL LIGATION      Family History  Problem Relation Age of Onset  . Hypertension Mother   . Hyperlipidemia Father   . Hypertension Father   . Stroke Father   . Arthritis Other     Social History   Socioeconomic History  . Marital status: Married    Spouse name: None  . Number of children: None  . Years of education: None  . Highest education level: None  Social Needs  . Financial resource strain: None  . Food insecurity - worry: None  . Food insecurity - inability: None  . Transportation needs - medical: None  . Transportation needs - non-medical: None  Occupational History  . None  Tobacco Use  . Smoking status: Former Smoker    Years: 5.00    Types: Cigarettes    Last attempt to quit:  04/07/1990    Years since quitting: 27.2  . Smokeless tobacco: Never Used  . Tobacco comment: light smoker  Substance and Sexual Activity  . Alcohol use: No    Alcohol/week: 0.0 oz  . Drug use: No  . Sexual activity: None  Other Topics Concern  . None                               Current Outpatient Medications:  .  albuterol (VENTOLIN HFA) 108 (90 BASE) MCG/ACT inhaler, Inhale 2 puffs into the lungs daily as needed.  , Disp: , Rfl:  .  ciclesonide (ALVESCO) 160 MCG/ACT inhaler, Inhale 1 puff into the lungs 2 (two) times daily. Per Dr Willa RoughHicks, Disp: , Rfl:  .  fluticasone (FLOVENT HFA) 110 MCG/ACT inhaler, Inhale 2 puffs into the lungs 2 (two) times daily., Disp: 1 Inhaler, Rfl: 3 .  levocetirizine (XYZAL) 5 MG tablet, Take 5 mg by mouth every evening., Disp: , Rfl:  .  levothyroxine (SYNTHROID, LEVOTHROID) 50 MCG tablet, TAKE 1 TABLET ONCE A DAY (IN THE MORNING) 30 TO 60 MINUTES BEFORE FOOD OR DRINK, Disp: , Rfl: 0 .  SINGULAIR 10 MG tablet, TAKE ONE TABLET EACH EVENING TO PREVENT COUGH OR WHEEZE., Disp: 30 tablet, Rfl: 3 .  valsartan-hydrochlorothiazide (DIOVAN-HCT) 160-12.5 MG tablet, TAKE ONE TABLET BY MOUTH ONE TIME DAILY, Disp: 90 tablet, Rfl: 1  EXAM:  Vitals:   06/15/17 0847  BP: 122/70  Pulse: 87  Temp: 98 F (36.7 C)    Body mass index is 34.76 kg/m.  GENERAL: vitals reviewed and listed above, alert, oriented, appears well hydrated and in no acute distress  HEENT: atraumatic, conjunttiva clear, no obvious abnormalities on inspection of external nose and ears  NECK: no obvious masses on inspection  LUNGS: clear to auscultation bilaterally, no wheezes, rales or rhonchi, good air movement  CV: HRRR, no peripheral edema  MS: moves all extremities without noticeable abnormality  PSYCH: pleasant and cooperative, no obvious depression or anxiety  ASSESSMENT AND PLAN:  Discussed the following assessment and plan:  Essential hypertension - Plan: Basic  metabolic panel, CBC with Differential/Platelet -Labs per orders -Discussed recalls, advised her to check with her pharmacy  Vaginal discharge -Discussed potential etiologies -She opted to try over-the-counter yeast treatment and follow-up with [place] or GYN if this persists  Hypothyroidism, unspecified type - Plan: TSH -Continue current treatment, adjust medicine as needed pending lab results  Dyslipidemia - Plan: Lipid panel BMI 34.0-34.9,adult Lifestyle recommendations  She declined hepatitis C screening  -Patient advised to return or notify a doctor immediately if symptoms worsen or persist or new concerns arise.  There are no Patient Instructions on file for this visit.  Terressa Koyanagi, DO

## 2017-06-15 ENCOUNTER — Encounter: Payer: Self-pay | Admitting: Family Medicine

## 2017-06-15 ENCOUNTER — Ambulatory Visit: Payer: PRIVATE HEALTH INSURANCE | Admitting: Family Medicine

## 2017-06-15 VITALS — BP 122/70 | HR 87 | Temp 98.0°F | Ht 67.25 in | Wt 223.6 lb

## 2017-06-15 DIAGNOSIS — E785 Hyperlipidemia, unspecified: Secondary | ICD-10-CM | POA: Diagnosis not present

## 2017-06-15 DIAGNOSIS — Z6834 Body mass index (BMI) 34.0-34.9, adult: Secondary | ICD-10-CM | POA: Diagnosis not present

## 2017-06-15 DIAGNOSIS — I10 Essential (primary) hypertension: Secondary | ICD-10-CM

## 2017-06-15 DIAGNOSIS — N898 Other specified noninflammatory disorders of vagina: Secondary | ICD-10-CM

## 2017-06-15 DIAGNOSIS — E039 Hypothyroidism, unspecified: Secondary | ICD-10-CM

## 2017-06-15 LAB — LIPID PANEL
Cholesterol: 172 mg/dL (ref 0–200)
HDL: 52.7 mg/dL (ref >39.00)
LDL CALC: 104 mg/dL — AB (ref 0–99)
NONHDL: 119.25
Total CHOL/HDL Ratio: 3
Triglycerides: 76 mg/dL (ref 0.0–149.0)
VLDL: 15.2 mg/dL (ref 0.0–40.0)

## 2017-06-15 LAB — BASIC METABOLIC PANEL
BUN: 10 mg/dL (ref 6–23)
CHLORIDE: 101 meq/L (ref 96–112)
CO2: 30 meq/L (ref 19–32)
Calcium: 9.6 mg/dL (ref 8.4–10.5)
Creatinine, Ser: 0.63 mg/dL (ref 0.40–1.20)
GFR: 126.42 mL/min (ref >60.00)
GLUCOSE: 91 mg/dL (ref 70–99)
POTASSIUM: 4.3 meq/L (ref 3.5–5.1)
Sodium: 139 mEq/L (ref 135–145)

## 2017-06-15 LAB — CBC WITH DIFFERENTIAL/PLATELET
BASOS PCT: 0.6 % (ref 0.0–3.0)
Basophils Absolute: 0 10*3/uL (ref 0.0–0.1)
EOS PCT: 1.8 % (ref 0.0–5.0)
Eosinophils Absolute: 0.1 10*3/uL (ref 0.0–0.7)
HCT: 41.1 % (ref 36.0–46.0)
HEMOGLOBIN: 13.8 g/dL (ref 12.0–15.0)
Lymphocytes Relative: 40.6 % (ref 12.0–46.0)
Lymphs Abs: 1.6 10*3/uL (ref 0.7–4.0)
MCHC: 33.7 g/dL (ref 30.0–36.0)
MCV: 90.8 fl (ref 78.0–100.0)
Monocytes Absolute: 0.4 10*3/uL (ref 0.1–1.0)
Monocytes Relative: 9.8 % (ref 3.0–12.0)
NEUTROS ABS: 1.8 10*3/uL (ref 1.4–7.7)
Neutrophils Relative %: 47.2 % (ref 43.0–77.0)
PLATELETS: 317 10*3/uL (ref 150.0–400.0)
RBC: 4.53 Mil/uL (ref 3.87–5.11)
RDW: 13.3 % (ref 11.5–15.5)
WBC: 3.9 10*3/uL — ABNORMAL LOW (ref 4.0–10.5)

## 2017-06-15 NOTE — Patient Instructions (Signed)
BEFORE YOU LEAVE: -Labs -follow up: 3-4 months  Please check with the pharmacy about the recent blood pressure medication recalls.  Try over-the-counter yeast treatment for the discharge.  If this does not resolve the symptoms please see her gynecologist or follow-up here.  We have ordered labs or studies at this visit. It can take up to 1-2 weeks for results and processing. IF results require follow up or explanation, we will call you with instructions. Clinically stable results will [facility]. If you have not heard from us or cannot find your results in [facility] in [facility] weeks please contact our office at [phone].  If you are not yet signed up for [facility] SMYCHART, please consider signing up.   We recommend the following healthy lifestyle for LIFE: 1) Small portions. But, make sure to get regular (at least 3 per day), healthy meals and small healthy snacks if needed.  2) Eat a healthy clean diet.   TRY TO EAT: -at least 5-7 servings of low sugar, colorful, and nutrient rich vegetables per day (not corn, potatoes or bananas.) -berries are the best choice if you wish to eat fruit (only eat small amounts if trying to reduce weight)  -lean meets (fish, white meat of chicken or [place]) -vegan proteins for some meals - beans or tofu, whole grains, nuts and seeds -Replace bad fats with good fats - good fats include: fish, nuts and seeds, canola oil, olive oil -small amounts of low fat or non fat dairy -small amounts of100 % whole grains - check the lables -drink plenty of water  AVOID: -SUGAR, sweets, anything with added sugar, corn syrup or sweeteners - must read labels as even foods advertised as "healthy" often are loaded with sugar -if you must have a sweetener, small amounts of stevia may be best -sweetened beverages and artificially sweetened beverages -simple starches (rice, bread, potatoes, pasta, chips, etc - small amounts of 100% whole grains are ok) -red meat, pork,  butter -fried foods, fast food, processed food, excessive dairy, eggs and coconut.  3)Get at least 150 minutes of sweaty aerobic exercise per week.  4)Reduce stress - consider counseling, meditation and relaxation to balance other aspects of your life.

## 2017-06-16 LAB — TSH: TSH: 3.68 u[IU]/mL (ref 0.35–4.50)

## 2017-08-18 ENCOUNTER — Encounter: Payer: Self-pay | Admitting: Family Medicine

## 2017-08-18 ENCOUNTER — Ambulatory Visit: Payer: PRIVATE HEALTH INSURANCE | Admitting: Family Medicine

## 2017-08-18 VITALS — BP 112/70 | HR 90 | Temp 98.3°F | Ht 67.25 in | Wt 229.6 lb

## 2017-08-18 DIAGNOSIS — S6991XA Unspecified injury of right wrist, hand and finger(s), initial encounter: Secondary | ICD-10-CM | POA: Diagnosis not present

## 2017-08-18 NOTE — Progress Notes (Signed)
  HPI:  Using dictation device. Unfortunately this device frequently misinterprets words/phrases.  Acute visit for R hand pani: -hit hand on door frame two days ago -has had some pain in 2nd-4 digits, forearm  -has not required any treatment for the pain -denies : swelling, bruising, numbness, weakness, deformity  ROS: See pertinent positives and negatives per HPI.  Past Medical History:  Diagnosis Date  . Allergy    allergic rhinitis-- Dr Willa Rough, allergist  . Hypertension   . Infertility, female   . Thyroid disease    hypothyroidism    Past Surgical History:  Procedure Laterality Date  . TUBAL LIGATION      Family History  Problem Relation Age of Onset  . Hypertension Mother   . Hyperlipidemia Father   . Hypertension Father   . Stroke Father   . Arthritis Other     SOCIAL HX: see hpi   Current Outpatient Medications:  .  albuterol (VENTOLIN HFA) 108 (90 BASE) MCG/ACT inhaler, Inhale 2 puffs into the lungs daily as needed.  , Disp: , Rfl:  .  ciclesonide (ALVESCO) 160 MCG/ACT inhaler, Inhale 1 puff into the lungs 2 (two) times daily. Per Dr Willa Rough, Disp: , Rfl:  .  fluticasone (FLOVENT HFA) 110 MCG/ACT inhaler, Inhale 2 puffs into the lungs 2 (two) times daily., Disp: 1 Inhaler, Rfl: 3 .  levocetirizine (XYZAL) 5 MG tablet, Take 5 mg by mouth every evening., Disp: , Rfl:  .  levothyroxine (SYNTHROID, LEVOTHROID) 50 MCG tablet, TAKE 1 TABLET ONCE A DAY (IN THE MORNING) 30 TO 60 MINUTES BEFORE FOOD OR DRINK, Disp: , Rfl: 0 .  SINGULAIR 10 MG tablet, TAKE ONE TABLET EACH EVENING TO PREVENT COUGH OR WHEEZE., Disp: 30 tablet, Rfl: 3 .  valsartan-hydrochlorothiazide (DIOVAN-HCT) 160-12.5 MG tablet, TAKE ONE TABLET BY MOUTH ONE TIME DAILY, Disp: 90 tablet, Rfl: 1  EXAM:  Vitals:   08/18/17 1409  BP: 112/70  Pulse: 90  Temp: 98.3 F (36.8 C)    Body mass index is 35.69 kg/m.  GENERAL: vitals reviewed and listed above, alert, oriented, appears well hydrated and in no  acute distress  HEENT: atraumatic, conjunttiva clear, no obvious abnormalities on inspection of external nose and ears  NECK: no obvious masses on inspection  MS: moves all extremities without noticeable abnormality, no obvious deformity, swelling or redness of the fingers, hand or arm.  Normal sensitivity to touch and strength throughout the fingers, hand, wrist and forearm.  Normal range of motion of fingers and wrists and forearm.  Neurovascularly intact distally.  PSYCH: pleasant and cooperative, no obvious depression or anxiety  ASSESSMENT AND PLAN:  Discussed the following assessment and plan:  Injury of right hand, initial encounter  -No signs or findings to suggest serious injury, suspect contusion/strain -She opted to monitor, not requiring any treatment at this time -Advised to follow-up if any worsening or persistent symptoms over the next few weeks or new concerns -She reports she has routine follow-up for chronic disease scheduled already later this year Declined AVS  There are no Patient Instructions on file for this visit.  Terressa Koyanagi, DO

## 2017-08-21 LAB — HM PAP SMEAR

## 2017-08-21 LAB — HM MAMMOGRAPHY

## 2017-09-17 LAB — BASIC METABOLIC PANEL: Glucose: 104

## 2017-09-17 LAB — LIPID PANEL
CHOLESTEROL: 169 (ref 0–200)
HDL: 45 (ref 35–70)
LDL CALC: 89
Triglycerides: 177 — AB (ref 40–160)

## 2017-09-20 ENCOUNTER — Other Ambulatory Visit: Payer: Self-pay | Admitting: Family Medicine

## 2017-09-20 DIAGNOSIS — I10 Essential (primary) hypertension: Secondary | ICD-10-CM

## 2017-10-13 ENCOUNTER — Encounter: Payer: Self-pay | Admitting: Family Medicine

## 2017-10-23 ENCOUNTER — Encounter: Payer: Self-pay | Admitting: Family Medicine

## 2017-12-17 ENCOUNTER — Ambulatory Visit: Payer: PRIVATE HEALTH INSURANCE | Admitting: Family Medicine

## 2017-12-25 LAB — HEMOGLOBIN A1C: HEMOGLOBIN A1C: 5.4

## 2018-01-13 NOTE — Progress Notes (Signed)
HPI:  Using dictation device. Unfortunately this device frequently misinterprets words/phrases.  Brianna [name] is a pleasant 57 y.o. here for follow up. Chronic medical problems summarized below were reviewed for changes and stability and were updated as needed below. These issues and their treatment remain stable for the most part. Doing well for the most part. Not exercising over the summer - too hot. New issue of chronic PND, for several years - globus sensation. Sneezing and some [place] at times.musinex does not help. No sig acid issues she is aware of. Not using nasal spray. Denies CP, SOB, DOE, treatment intolerance or new symptoms. Due for labs, hep c screen - refused , flu vaccine - refused, doing at work HTN: -meds: valsartan-hctz  Obesity/HLD/Hyperglycemia: -no meds  Hypothyroidism: -meds: levothyroxine  Allergies/Asthma: -meds: singulair, xyzal, flonase- alb prn   ROS: See pertinent positives and negatives per HPI.  Past Medical History:  Diagnosis Date  . Allergy    allergic rhinitis-- Dr Willa Rough, allergist  . Hypertension   . Infertility, female   . Thyroid disease    hypothyroidism    Past Surgical History:  Procedure Laterality Date  . TUBAL LIGATION      Family History  Problem Relation Age of Onset  . Hypertension Mother   . Hyperlipidemia Father   . Hypertension Father   . Stroke Father   . Arthritis Other     SOCIAL HX: see hpi   Current Outpatient Medications:  .  albuterol (VENTOLIN HFA) 108 (90 BASE) MCG/ACT inhaler, Inhale 2 puffs into the lungs daily as needed.  , Disp: , Rfl:  .  ciclesonide (ALVESCO) 160 MCG/ACT inhaler, Inhale 1 puff into the lungs 2 (two) times daily. Per Dr Willa Rough, Disp: , Rfl:  .  fluticasone (FLOVENT HFA) 110 MCG/ACT inhaler, Inhale 2 puffs into the lungs 2 (two) times daily., Disp: 1 Inhaler, Rfl: 3 .  levocetirizine (XYZAL) 5 MG tablet, Take 5 mg by mouth every evening., Disp: , Rfl:  .  levothyroxine  (SYNTHROID, LEVOTHROID) 50 MCG tablet, TAKE 1 TABLET ONCE A DAY (IN THE MORNING) 30 TO 60 MINUTES BEFORE FOOD OR DRINK, Disp: , Rfl: 0 .  SINGULAIR 10 MG tablet, TAKE ONE TABLET EACH EVENING TO PREVENT COUGH OR WHEEZE., Disp: 30 tablet, Rfl: 3 .  valsartan-hydrochlorothiazide (DIOVAN-HCT) 160-12.5 MG tablet, TAKE ONE TABLET BY MOUTH ONE TIME DAILY, Disp: 90 tablet, Rfl: 1 .  fluticasone (FLONASE) 50 MCG/ACT nasal spray, Place 1 spray into both nostrils daily., Disp: 16 g, Rfl: 6  EXAM:  Vitals:   01/14/18 0809  BP: 132/70  Pulse: 95  Temp: 98.1 F (36.7 C)  SpO2: 94%    Body mass index is 36.74 kg/m.  GENERAL: vitals reviewed and listed above, alert, oriented, appears well hydrated and in no acute distress  HEENT: atraumatic, conjunttiva clear, no obvious abnormalities on inspection of external nose and ears, normal appearance of ear canals and TMs, clear nasal congestion, mild post oropharyngeal erythema with PND, no tonsillar edema or exudate, no sinus TTP  NECK: no obvious masses on inspection  LUNGS: clear to auscultation bilaterally, no wheezes, rales or rhonchi, good air movement  CV: HRRR, no peripheral edema  MS: moves all extremities without noticeable abnormality  PSYCH: pleasant and cooperative, no obvious depression or anxiety  ASSESSMENT AND PLAN:  Discussed the following assessment and plan:  Essential hypertension - Plan: Basic metabolic panel, CBC  Hypothyroidism, unspecified type  Morbid obesity (HCC) - Plan: Hemoglobin A1c  Globus sensation  Allergic rhinitis, unspecified seasonality, unspecified trigger  -labs -discussed common and potential etiologies globus sensation - opted to try adding flonase for likely PND, if not successful try trial acid reducer, if not successful ENT eval. Advise to let us know if does not clear up -lifestyle recs, weight reduction advised -refused flu shot here, refused hep c screening   Patient Instructions  BEFORE  YOU LEAVE: -labs -follow up: 3-4 months, sooner as needed  Flonase 2 sprays each nostril daily for 1 month, then 1 spray each nostril daily. Let us know if this does not take care of the issue in 1-2 months.  We have ordered labs or studies at this visit. It can take up to 1-2 weeks for results and processing. IF results require follow up or explanation, we will call you with instructions. Clinically stable results will be released to your [facility]. If you have not heard from [place] or cannot find your results in [facility] in 2 weeks please contact our office at [phone].  If you are not yet signed up for [facility], please consider signing up.   Eat a healthy low sugar diet and get at least 150 minutes of exercise per week. Shoot for a 10-20 lb weight reduction over the next 3-6 months.  We recommend the following healthy lifestyle for LIFE: 1) Small portions. But, make sure to get regular (at least 3 per day), healthy meals and small healthy snacks if needed.  2) Eat a healthy clean diet.   TRY TO EAT: -at least 5-7 servings of low sugar, colorful, and nutrient rich vegetables per day (not corn, potatoes or bananas.) -berries are the best choice if you wish to eat fruit (only eat small amounts if trying to reduce weight)  -lean meets (fish, white meat of chicken or [place]) -vegan proteins for some meals - beans or tofu, whole grains, nuts and seeds -Replace bad fats with good fats - good fats include: fish, nuts and seeds, canola oil, olive oil -small amounts of low fat or non fat dairy -small amounts of100 % whole grains - check the lables -drink plenty of water  AVOID: -SUGAR, sweets, anything with added sugar, corn syrup or sweeteners - must read labels as even foods advertised as "healthy" often are loaded with sugar -if you must have a sweetener, small amounts of stevia may be best -sweetened beverages and artificially sweetened beverages -simple starches (rice, bread, potatoes,  pasta, chips, etc - small amounts of 100% whole grains are ok) -red meat, pork, butter -fried foods, fast food, processed food, excessive dairy, eggs and coconut.  3)Get at least 150 minutes of sweaty aerobic exercise per week.  4)Reduce stress - consider counseling, meditation and relaxation to balance other aspects of your life.          Terressa Koyanagi, DO

## 2018-01-14 ENCOUNTER — Encounter: Payer: Self-pay | Admitting: Family Medicine

## 2018-01-14 ENCOUNTER — Ambulatory Visit: Payer: PRIVATE HEALTH INSURANCE | Admitting: Family Medicine

## 2018-01-14 VITALS — BP 132/70 | HR 95 | Temp 98.1°F | Wt 236.3 lb

## 2018-01-14 DIAGNOSIS — J309 Allergic rhinitis, unspecified: Secondary | ICD-10-CM

## 2018-01-14 DIAGNOSIS — R0989 Other specified symptoms and signs involving the circulatory and respiratory systems: Secondary | ICD-10-CM | POA: Diagnosis not present

## 2018-01-14 DIAGNOSIS — I10 Essential (primary) hypertension: Secondary | ICD-10-CM

## 2018-01-14 DIAGNOSIS — E039 Hypothyroidism, unspecified: Secondary | ICD-10-CM

## 2018-01-14 LAB — CBC
HCT: 42 % (ref 36.0–46.0)
Hemoglobin: 14.1 g/dL (ref 12.0–15.0)
MCHC: 33.7 g/dL (ref 30.0–36.0)
MCV: 91.1 fl (ref 78.0–100.0)
PLATELETS: 329 10*3/uL (ref 150.0–400.0)
RBC: 4.61 Mil/uL (ref 3.87–5.11)
RDW: 13 % (ref 11.5–15.5)
WBC: 4.8 10*3/uL (ref 4.0–10.5)

## 2018-01-14 LAB — BASIC METABOLIC PANEL
BUN: 13 mg/dL (ref 6–23)
CO2: 31 meq/L (ref 19–32)
Calcium: 9.8 mg/dL (ref 8.4–10.5)
Chloride: 102 mEq/L (ref 96–112)
Creatinine, Ser: 0.77 mg/dL (ref 0.40–1.20)
GFR: 100.07 mL/min (ref >60.00)
GLUCOSE: 94 mg/dL (ref 70–99)
POTASSIUM: 4.2 meq/L (ref 3.5–5.1)
Sodium: 140 mEq/L (ref 135–145)

## 2018-01-14 LAB — HEMOGLOBIN A1C: Hgb A1c MFr Bld: 5.8 % (ref 4.6–6.5)

## 2018-01-14 MED ORDER — FLUTICASONE PROPIONATE 50 MCG/ACT NA SUSP: 1.0000 | Freq: Every day | NASAL | 6 refills | Status: AC

## 2018-01-14 NOTE — Patient Instructions (Signed)
BEFORE YOU LEAVE: -labs -follow up: 3-4 months, sooner as needed  Flonase 2 sprays each nostril daily for 1 month, then 1 spray each nostril daily. Let us know if this does not take care of the issue in 1-2 months.  We have ordered labs or studies at this visit. It can take up to 1-2 weeks for results and processing. IF results require follow up or explanation, we will call you with instructions. Clinically stable results will be released to your [facility]. If you have not heard from [place] or cannot find your results in [facility] in 2 weeks please contact our office at [phone].  If you are not yet signed up for [facility], please consider signing up.   Eat a healthy low sugar diet and get at least 150 minutes of exercise per week. Shoot for a 10-20 lb weight reduction over the next 3-6 months.  We recommend the following healthy lifestyle for LIFE: 1) Small portions. But, make sure to get regular (at least 3 per day), healthy meals and small healthy snacks if needed.  2) Eat a healthy clean diet.   TRY TO EAT: -at least 5-7 servings of low sugar, colorful, and nutrient rich vegetables per day (not corn, potatoes or bananas.) -berries are the best choice if you wish to eat fruit (only eat small amounts if trying to reduce weight)  -lean meets (fish, white meat of chicken or [place]) -vegan proteins for some meals - beans or tofu, whole grains, nuts and seeds -Replace bad fats with good fats - good fats include: fish, nuts and seeds, canola oil, olive oil -small amounts of low fat or non fat dairy -small amounts of100 % whole grains - check the lables -drink plenty of water  AVOID: -SUGAR, sweets, anything with added sugar, corn syrup or sweeteners - must read labels as even foods advertised as "healthy" often are loaded with sugar -if you must have a sweetener, small amounts of stevia may be best -sweetened beverages and artificially sweetened beverages -simple starches (rice, bread,  potatoes, pasta, chips, etc - small amounts of 100% whole grains are ok) -red meat, pork, butter -fried foods, fast food, processed food, excessive dairy, eggs and coconut.  3)Get at least 150 minutes of sweaty aerobic exercise per week.  4)Reduce stress - consider counseling, meditation and relaxation to balance other aspects of your life.

## 2018-03-16 ENCOUNTER — Other Ambulatory Visit: Payer: Self-pay | Admitting: Family Medicine

## 2018-03-16 DIAGNOSIS — I10 Essential (primary) hypertension: Secondary | ICD-10-CM

## 2018-03-29 ENCOUNTER — Encounter: Payer: Self-pay | Admitting: Family Medicine

## 2018-05-11 ENCOUNTER — Encounter: Payer: Self-pay | Admitting: Family Medicine

## 2018-05-11 ENCOUNTER — Ambulatory Visit: Payer: PRIVATE HEALTH INSURANCE | Admitting: Family Medicine

## 2018-05-11 VITALS — BP 124/80 | HR 83 | Temp 98.3°F | Ht 67.25 in | Wt 236.0 lb

## 2018-05-11 DIAGNOSIS — R0989 Other specified symptoms and signs involving the circulatory and respiratory systems: Secondary | ICD-10-CM

## 2018-05-11 DIAGNOSIS — E039 Hypothyroidism, unspecified: Secondary | ICD-10-CM

## 2018-05-11 DIAGNOSIS — K219 Gastro-esophageal reflux disease without esophagitis: Secondary | ICD-10-CM

## 2018-05-11 NOTE — Patient Instructions (Addendum)
BEFORE YOU LEAVE: -lab appointment for thyroid labs in 2 weeks  -follow up: 2-3 months  nexium daily x 2 weeks for acid reflux - follow up if worsening or persists  INSTRUCTIONS FOR UPPER RESPIRATORY INFECTION:  -plenty of rest and fluids  -nasal saline wash 2-3 times daily (use prepackaged nasal saline or bottled/distilled water if making your own)   -in the winter time, using a humidifier at night is helpful (please follow cleaning instructions)  -if you are taking a cough medication - use only as directed, may also try a teaspoon of honey to coat the throat and throat lozenges. Recommend musinex or delsum and cough drops. You have allergy listed that interacts with the tessalon so could not prescribe.  -for sore throat, salt water gargles can help  -follow up if you have fevers, facial pain, tooth pain, difficulty breathing or are worsening or symptoms persist longer then expected  Upper Respiratory Infection, Adult An upper respiratory infection (URI) is also known as the common cold. It is often caused by a type of germ (virus). Colds are easily spread (contagious). You can pass it to others by kissing, coughing, sneezing, or drinking out of the same glass. Usually, you get better in 1 to 3  weeks.  However, the cough can last for even longer. HOME CARE   Only take medicine as told by your doctor. Follow instructions provided above.  Drink enough water and fluids to keep your pee (urine) clear or pale yellow.  Get plenty of rest.  Return to work when your temperature is < 100 for 24 hours or as told by your doctor. You may use a face mask and wash your hands to stop your cold from spreading. GET HELP RIGHT AWAY IF:   After the first few days, you feel you are getting worse.  You have questions about your medicine.  You have chills, shortness of breath, or red spit (mucus).  You have pain in the face for more then 1-2 days, especially when you bend forward.  You have a  fever, puffy (swollen) neck, pain when you swallow, or white spots in the back of your throat.  You have a bad headache, ear pain, sinus pain, or chest pain.  You have a high-pitched whistling sound when you breathe in and out (wheezing).  You cough up blood.  You have sore muscles or a stiff neck. MAKE SURE YOU:   Understand these instructions.  Will watch your condition.  Will get help right away if you are not doing well or get worse. Document Released: 09/10/2007 Document Revised: 06/16/2011 Document Reviewed: 06/29/2013 [facility] Patient Information 2015 [place], [place]. This information is not intended to replace advice given to you by your health care provider. Make sure you discuss any questions you have with your health care provider.

## 2018-05-11 NOTE — Progress Notes (Signed)
HPI:  Using dictation device. Unfortunately this device frequently misinterprets words/phrases.   Acute visit for respiratory illness: -started: about 1 week ago -symptoms:nasal congestion, pnd, sore throat, cough, chills and nausea the first few days - now resolved, acid reflux -denies:fever, SOB, NVD, tooth pain -has tried: nothing -sick contacts/travel/risks: no reported flu, strep or tick exposure - yes exposed to flu with nephew; no travel to [place]  Hypothyroidism: -sees endocrine for this but not checked in a long time -reports endocrinologist had spinal surgery so prefers to check here -requests T3/4 as well as report endo does this and she feels is more thorough -no changes in med or symptoms recently  ROS: See pertinent positives and negatives per HPI.  Past Medical History:  Diagnosis Date  . Allergy    allergic rhinitis-- Dr Willa RoughHicks, allergist  . Hypertension   . Infertility, female   . Thyroid disease    hypothyroidism    Past Surgical History:  Procedure Laterality Date  . TUBAL LIGATION      Family History  Problem Relation Age of Onset  . Hypertension Mother   . Hyperlipidemia Father   . Hypertension Father   . Stroke Father   . Arthritis Other     Social History   Socioeconomic History  . Marital status: Married    Spouse name: Not on file  . Number of children: Not on file  . Years of education: Not on file  . Highest education level: Not on file  Occupational History  . Not on file  Social Needs  . Financial resource strain: Not on file  . Food insecurity:    Worry: Not on file    Inability: Not on file  . Transportation needs:    Medical: Not on file    Non-medical: Not on file  Tobacco Use  . Smoking status: Former Smoker    Years: 5.00    Types: Cigarettes    Last attempt to quit: 04/07/1990    Years since quitting: 28.1  . Smokeless tobacco: Never Used  . Tobacco comment: light smoker  Substance and Sexual Activity  .  Alcohol use: No    Alcohol/week: 0.0 standard drinks  . Drug use: No  . Sexual activity: Not on file  Lifestyle  . Physical activity:    Days per week: Not on file    Minutes per session: Not on file  . Stress: Not on file  Relationships  . Social connections:    Talks on phone: Not on file    Gets together: Not on file    Attends religious service: Not on file    Active member of club or organization: Not on file    Attends meetings of clubs or organizations: Not on file    Relationship status: Not on file  Other Topics Concern  . 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  albuterol (VENTOLIN HFA) 108 (90 BASE) MCG/ACT inhaler, Inhale 2 puffs into the lungs daily as needed.  , Disp: , Rfl:  .  ciclesonide (ALVESCO) 160 MCG/ACT inhaler, Inhale 1 puff into the lungs 2 (two) times daily. Per Dr Willa RoughHicks, Disp: , Rfl:  .  fluticasone (FLONASE) 50 MCG/ACT  nasal spray, Place 1 spray into both nostrils daily., Disp: 16 g, Rfl: 6 .  fluticasone (FLOVENT HFA) 110 MCG/ACT inhaler, Inhale 2 puffs into the lungs 2 (two) times daily., Disp: 1 Inhaler, Rfl: 3 .  levocetirizine (XYZAL) 5 MG tablet, Take 5 mg by mouth every evening., Disp: , Rfl:  .  levothyroxine (SYNTHROID, LEVOTHROID) 50 MCG tablet, TAKE 1 TABLET ONCE A DAY (IN THE MORNING) 30 TO 60 MINUTES BEFORE FOOD OR DRINK, Disp: , Rfl: 0 .  SINGULAIR 10 MG tablet, TAKE ONE TABLET EACH EVENING TO PREVENT COUGH OR WHEEZE., Disp: 30 tablet, Rfl: 3 .  valsartan-hydrochlorothiazide (DIOVAN-HCT) 160-12.5 MG tablet, TAKE ONE TABLET BY MOUTH ONE TIME DAILY, Disp: 30 tablet, Rfl: 1  EXAM:  Vitals:   05/11/18 0906  BP: 124/80  Pulse: 83  Temp: 98.3 F (36.8 C)  SpO2: 97%    Body mass index is 36.69 kg/m.  GENERAL: vitals reviewed and  listed above, alert, oriented, appears well hydrated and in no acute distress  HEENT: atraumatic, conjunttiva clear, no obvious abnormalities on inspection of external nose and ears, normal appearance of ear canals and TMs, clear nasal congestion, mild post oropharyngeal erythema with PND, no tonsillar edema or exudate, no sinus TTP  NECK: no obvious masses on inspection  LUNGS: clear to auscultation bilaterally, no wheezes, rales or rhonchi, good air movement  CV: HRRR, no peripheral edema  MS: moves all extremities without noticeable abnormality  PSYCH: pleasant and cooperative, no obvious depression or anxiety  ASSESSMENT AND PLAN:  Discussed the following assessment and plan:  Upper respiratory symptom  Gastroesophageal reflux disease, esophagitis presence not specified  Hypothyroidism, unspecified type - Plan: TSH, T4, free, T3, free  -given HPI and exam findings today, a serious infection or illness is unlikely. We discussed potential etiologies, with VURI or recovering influenza being most likely, and advised supportive care and monitoring. We discussed treatment side effects, likely course, antibiotic misuse, transmission, and signs of developing a serious illness. -labs per orders for thyroid per her request - will plan to send to endo fro management -short course nexium for acid, follow up if worsening or persists -regular f/u 2 months advised -of course, we advised to return or notify a doctor immediately if symptoms worsen or persist or new concerns arise.    Patient Instructions  BEFORE YOU LEAVE: -lab appointment for thyroid labs in 2 weeks  -follow up: 2-3 months   INSTRUCTIONS FOR UPPER RESPIRATORY INFECTION:  -plenty of rest and fluids  -nasal saline wash 2-3 times daily (use prepackaged nasal saline or bottled/distilled water if making your own)   -in the winter time, using a humidifier at night is helpful (please follow cleaning instructions)  -if you  are taking a cough medication - use only as directed, may also try a teaspoon of honey to coat the throat and throat lozenges. Recommend musinex or delsum and cough drops. You have allergy listed that interacts with the tessalon so could not prescribe.  -for sore throat, salt water gargles can help  -follow up if you have fevers, facial pain, tooth pain, difficulty breathing or are worsening or symptoms persist longer then expected  Upper Respiratory Infection, Adult An upper respiratory infection (URI) is also known as the common cold. It is often caused by a type of germ (virus). Colds are easily spread (contagious). You can pass it to others by kissing, coughing, sneezing, or drinking out of the same glass. Usually, you get better in 1 to  3  weeks.  However, the cough can last for even longer. HOME CARE   Only take medicine as told by your doctor. Follow instructions provided above.  Drink enough water and fluids to keep your pee (urine) clear or pale yellow.  Get plenty of rest.  Return to work when your temperature is < 100 for 24 hours or as told by your doctor. You may use a face mask and wash your hands to stop your cold from spreading. GET HELP RIGHT AWAY IF:   After the first few days, you feel you are getting worse.  You have questions about your medicine.  You have chills, shortness of breath, or red spit (mucus).  You have pain in the face for more then 1-2 days, especially when you bend forward.  You have a fever, puffy (swollen) neck, pain when you swallow, or white spots in the back of your throat.  You have a bad headache, ear pain, sinus pain, or chest pain.  You have a high-pitched whistling sound when you breathe in and out (wheezing).  You cough up blood.  You have sore muscles or a stiff neck. MAKE SURE YOU:   Understand these instructions.  Will watch your condition.  Will get help right away if you are not doing well or get worse. Document Released:  09/10/2007 Document Revised: 06/16/2011 Document Reviewed: 06/29/2013 [facility] Patient Information 2015 [place], [place]. This information is not intended to replace advice given to you by your health care provider. Make sure you discuss any questions you have with your health care provider.     Terressa Koyanagi, DO

## 2018-05-17 ENCOUNTER — Other Ambulatory Visit: Payer: Self-pay | Admitting: Family Medicine

## 2018-05-17 DIAGNOSIS — I10 Essential (primary) hypertension: Secondary | ICD-10-CM

## 2018-05-25 ENCOUNTER — Other Ambulatory Visit (INDEPENDENT_AMBULATORY_CARE_PROVIDER_SITE_OTHER): Payer: PRIVATE HEALTH INSURANCE

## 2018-05-25 DIAGNOSIS — E039 Hypothyroidism, unspecified: Secondary | ICD-10-CM

## 2018-05-25 LAB — T4, FREE: Free T4: 0.77 ng/dL (ref 0.60–1.60)

## 2018-05-25 LAB — TSH: TSH: 2.99 u[IU]/mL (ref 0.35–4.50)

## 2018-05-25 LAB — T3, FREE: T3, Free: 3.9 pg/mL (ref 2.3–4.2)

## 2018-07-26 ENCOUNTER — Other Ambulatory Visit: Payer: Self-pay

## 2018-07-26 ENCOUNTER — Encounter: Payer: Self-pay | Admitting: Family Medicine

## 2018-07-26 ENCOUNTER — Telehealth: Payer: Self-pay | Admitting: Family Medicine

## 2018-07-26 ENCOUNTER — Ambulatory Visit (INDEPENDENT_AMBULATORY_CARE_PROVIDER_SITE_OTHER): Payer: PRIVATE HEALTH INSURANCE | Admitting: Family Medicine

## 2018-07-26 DIAGNOSIS — J309 Allergic rhinitis, unspecified: Secondary | ICD-10-CM | POA: Diagnosis not present

## 2018-07-26 DIAGNOSIS — E039 Hypothyroidism, unspecified: Secondary | ICD-10-CM | POA: Diagnosis not present

## 2018-07-26 DIAGNOSIS — I10 Essential (primary) hypertension: Secondary | ICD-10-CM | POA: Diagnosis not present

## 2018-07-26 NOTE — Telephone Encounter (Signed)
Copied from CRM [id number]. Topic: Quick Communication - Rx Refill/Question >> Jul 26, 2018  8:27 AM Burchel, Abbi R wrote: Medication: valsartan-hydrochlorothiazide (DIOVAN-HCT) 160-12.5 MG tablet  Pt states the pharmacy is unable to get this rx for her and she is unsure what she should do going forward.  Please advise.   Preferred Pharmacy:  [facility] [phone] - [place], [place] - [address]  [phone] (Phone) [phone] (Fax)

## 2018-07-26 NOTE — Telephone Encounter (Signed)
Pt stated able to get at [facility]

## 2018-07-26 NOTE — Progress Notes (Signed)
Virtual Visit via Video Note  I connected with Brianna [name] on 07/26/18 at 11:00 AM EDT by a video enabled telemedicine application and verified that I am speaking with the correct person using two identifiers.  Location patient: home Location provider: home office Persons participating in the virtual visit: patient, provider  I discussed the limitations of evaluation and management by telemedicine and the availability of in person appointments. The patient expressed understanding and agreed to proceed.   HPI: Pt following up on chronic conditions and TOC, previously seen by Dr. Selena BattenKim.  Allergies: singulair, zyxal, flonase, clarinex.  More symptoms during spring and fall.  HTN:  Not checking bp at home.  May drink 4 cups of water.  Not adding extra salt to foods, eating whatever.  Pt was having issues getting valsartan-HCTZ 160-12.5 mg.  States [facility] was out of med but [facility]  Pt states she was not given the option of filling the two meds separately instead of taking the combo pill.  Hypothyroidism:  Levothyroxine 50 mcg and a medication not on her med list that she spells out (licothaxine? 5 mg)   ROS: See pertinent positives and negatives per HPI.  Past Medical History:  Diagnosis Date  . Allergy    allergic rhinitis-- Dr Willa RoughHicks, allergist  . Hypertension   . Infertility, female   . Thyroid disease    hypothyroidism    Past Surgical History:  Procedure Laterality Date  . TUBAL LIGATION      Family History  Problem Relation Age of Onset  . Hypertension Mother   . Hyperlipidemia Father   . Hypertension Father   . Stroke Father   . Arthritis Other     SOCIAL HX: Pt denies EtOH, tobacco, and drug use.  Pt works at [employer].   Current Outpatient Medications:  .  albuterol (VENTOLIN HFA) 108 (90 BASE) MCG/ACT inhaler, Inhale 2 puffs into the lungs daily as needed.  , Disp: , Rfl:  .  ciclesonide (ALVESCO)  160 MCG/ACT inhaler, Inhale 1 puff into the lungs 2 (two) times daily. Per Dr Willa RoughHicks, Disp: , Rfl:  .  fluticasone (FLONASE) 50 MCG/ACT nasal spray, Place 1 spray into both nostrils daily., Disp: 16 g, Rfl: 6 .  fluticasone (FLOVENT HFA) 110 MCG/ACT inhaler, Inhale 2 puffs into the lungs 2 (two) times daily., Disp: 1 Inhaler, Rfl: 3 .  levocetirizine (XYZAL) 5 MG tablet, Take 5 mg by mouth every evening., Disp: , Rfl:  .  levothyroxine (SYNTHROID, LEVOTHROID) 50 MCG tablet, TAKE 1 TABLET ONCE A DAY (IN THE MORNING) 30 TO 60 MINUTES BEFORE FOOD OR DRINK, Disp: , Rfl: 0 .  SINGULAIR 10 MG tablet, TAKE ONE TABLET EACH EVENING TO PREVENT COUGH OR WHEEZE., Disp: 30 tablet, Rfl: 3 .  valsartan-hydrochlorothiazide (DIOVAN-HCT) 160-12.5 MG tablet, TAKE 1 TABLET BY MOUTH EVERY DAY, Disp: 30 tablet, Rfl: 3  EXAM:  VITALS per patient if applicable:  RR between 12-20 bpm  GENERAL: alert, oriented, appears well and in no acute distress  HEENT: atraumatic, conjunctiva clear, no obvious abnormalities on inspection of external nose and ears  NECK: normal movements of the head and neck  LUNGS: on inspection no signs of respiratory distress, breathing rate appears normal, no obvious gross SOB, gasping or wheezing  CV: no obvious cyanosis  MS: moves all visible extremities without noticeable abnormality  PSYCH/NEURO: pleasant and cooperative, no obvious depression or anxiety, speech and thought processing  grossly intact  ASSESSMENT AND PLAN:  Discussed the following assessment and plan:  Essential hypertension -continue valsartan-HCTZ 160-12.5 mg -discussed lifestyle changes.  Allergic rhinitis, unspecified seasonality, unspecified trigger -continue current meds -avoid triggers when possible  Hypothyroidism, unspecified type -last TSH 2.99 on 05/25/18 -continue levothyroxine 50 mcg and other medication  F/u prn in the next few months.   I discussed the assessment and treatment plan with the  patient. The patient was provided an opportunity to ask questions and all were answered. The patient agreed with the plan and demonstrated an understanding of the instructions.   The patient was advised to call back or seek an in-person evaluation if the symptoms worsen or if the condition fails to improve as anticipated.   Deeann Saint, MD

## 2018-08-31 ENCOUNTER — Encounter: Payer: PRIVATE HEALTH INSURANCE | Admitting: Family Medicine

## 2018-09-27 ENCOUNTER — Telehealth: Payer: Self-pay | Admitting: Family Medicine

## 2018-09-27 DIAGNOSIS — I10 Essential (primary) hypertension: Secondary | ICD-10-CM

## 2018-09-27 MED ORDER — VALSARTAN-HYDROCHLOROTHIAZIDE 160-12.5 MG PO TABS: 1.0000 | ORAL_TABLET | Freq: Every day | ORAL | 3 refills | Status: DC

## 2018-09-27 NOTE — Telephone Encounter (Signed)
Medication Refill - Medication: valsartan-hydrochlorothiazide (DIOVAN-HCT) 160-12.5 MG tablet  Preferred Pharmacy:  [id number] - [place], [place] - [address] [phone] (Phone) [phone] (Fax)

## 2018-09-27 NOTE — Telephone Encounter (Signed)
Ok to refill 

## 2018-09-27 NOTE — Telephone Encounter (Signed)
Rx done. 

## 2018-09-27 NOTE — Telephone Encounter (Signed)
Sent to [facility]

## 2018-10-15 ENCOUNTER — Encounter: Payer: Self-pay | Admitting: Family Medicine

## 2018-11-12 ENCOUNTER — Encounter: Payer: PRIVATE HEALTH INSURANCE | Admitting: Family Medicine

## 2018-11-18 ENCOUNTER — Ambulatory Visit (INDEPENDENT_AMBULATORY_CARE_PROVIDER_SITE_OTHER): Payer: PRIVATE HEALTH INSURANCE | Admitting: Family Medicine

## 2018-11-18 ENCOUNTER — Other Ambulatory Visit: Payer: Self-pay

## 2018-11-18 ENCOUNTER — Encounter: Payer: Self-pay | Admitting: Family Medicine

## 2018-11-18 VITALS — BP 110/70 | HR 71 | Temp 98.0°F | Ht 67.25 in | Wt 241.0 lb

## 2018-11-18 DIAGNOSIS — Z0000 Encounter for general adult medical examination without abnormal findings: Secondary | ICD-10-CM | POA: Diagnosis not present

## 2018-11-18 DIAGNOSIS — I10 Essential (primary) hypertension: Secondary | ICD-10-CM

## 2018-11-18 DIAGNOSIS — E6609 Other obesity due to excess calories: Secondary | ICD-10-CM | POA: Diagnosis not present

## 2018-11-18 LAB — BASIC METABOLIC PANEL
BUN: 11 mg/dL (ref 6–23)
CO2: 30 mEq/L (ref 19–32)
Calcium: 9.4 mg/dL (ref 8.4–10.5)
Chloride: 101 mEq/L (ref 96–112)
Creatinine, Ser: 0.74 mg/dL (ref 0.40–1.20)
GFR: 98.27 mL/min (ref >60.00)
Glucose, Bld: 97 mg/dL (ref 70–99)
Potassium: 4.2 mEq/L (ref 3.5–5.1)
Sodium: 139 mEq/L (ref 135–145)

## 2018-11-18 LAB — LIPID PANEL
Cholesterol: 177 mg/dL (ref 0–200)
HDL: 39.1 mg/dL (ref >39.00)
LDL Cholesterol: 116 mg/dL — ABNORMAL HIGH (ref 0–99)
NonHDL: 137.87
Total CHOL/HDL Ratio: 5
Triglycerides: 109 mg/dL (ref 0.0–149.0)
VLDL: 21.8 mg/dL (ref 0.0–40.0)

## 2018-11-18 LAB — CBC WITH DIFFERENTIAL/PLATELET
Basophils Absolute: 0 10*3/uL (ref 0.0–0.1)
Basophils Relative: 0.6 % (ref 0.0–3.0)
Eosinophils Absolute: 0.1 10*3/uL (ref 0.0–0.7)
Eosinophils Relative: 2.1 % (ref 0.0–5.0)
HCT: 40.9 % (ref 36.0–46.0)
Hemoglobin: 13.8 g/dL (ref 12.0–15.0)
Lymphocytes Relative: 38.5 % (ref 12.0–46.0)
Lymphs Abs: 1.9 10*3/uL (ref 0.7–4.0)
MCHC: 33.8 g/dL (ref 30.0–36.0)
MCV: 91.1 fl (ref 78.0–100.0)
Monocytes Absolute: 0.5 10*3/uL (ref 0.1–1.0)
Monocytes Relative: 10.6 % (ref 3.0–12.0)
Neutro Abs: 2.4 10*3/uL (ref 1.4–7.7)
Neutrophils Relative %: 48.2 % (ref 43.0–77.0)
Platelets: 317 10*3/uL (ref 150.0–400.0)
RBC: 4.49 Mil/uL (ref 3.87–5.11)
RDW: 13.1 % (ref 11.5–15.5)
WBC: 5 10*3/uL (ref 4.0–10.5)

## 2018-11-18 NOTE — Progress Notes (Signed)
Subjective:     Brianna [name] is a 58 y.o. female and is here for a comprehensive physical exam. The patient reports no problems. Followed by OB/Gyn for pap and mammogram.  Pt had TSH checked recently, states was normal.  Pt states she has been feeling good.  Notes may have been eating things she shouldn't have.  Social History   Socioeconomic History  . Marital status: Married    Spouse name: Not on file  . Number of children: Not on file  . Years of education: Not on file  . Highest education level: Not on file  Occupational History  . Not on file  Social Needs  . Financial resource strain: Not on file  . Food insecurity    Worry: Not on file    Inability: Not on file  . Transportation needs    Medical: Not on file    Non-medical: Not on file  Tobacco Use  . Smoking status: Former Smoker    Years: 5.00    Types: Cigarettes    Quit date: 04/07/1990    Years since quitting: 28.6  . Smokeless tobacco: Never Used  . Tobacco comment: light smoker  Substance and Sexual Activity  . Alcohol use: No    Alcohol/week: 0.0 standard drinks  . Drug use: No  . Sexual activity: Not on file  Lifestyle  . Physical activity    Days per week: Not on file    Minutes per session: Not on file  . Stress: Not on file  Relationships  . Social Musicianconnections    Talks on phone: Not on file    Gets together: Not on file    Attends religious service: Not on file    Active member of club or organization: Not on file    Attends meetings of clubs or organizations: Not on file    Relationship status: Not on file  . Intimate partner violence    Fear of current or ex partner: Not on file    Emotionally abused: Not on file    Physically abused: Not on file    Forced sexual activity: Not on file  Other Topics Concern  . 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 INFLUENZA VACCINE  11/06/2018  . Hepatitis C Screening  01/15/2019 (Originally 15-Sep-1960)  . HIV Screening  06/20/2024 (Originally 12/26/1975)  . MAMMOGRAM  08/22/2019  . PAP SMEAR-Modifier  08/21/2020  . TETANUS/TDAP  01/25/2022  . COLONOSCOPY  01/06/2024    The following portions of the patient's history were reviewed and updated as appropriate: allergies, current medications, past family history, past medical history, past social history, past surgical history and problem list.  Review of Systems Pertinent items noted in HPI and remainder of comprehensive ROS otherwise negative.   Objective:    BP 110/70   Pulse 71   Temp 98 F (36.7 C) (Oral)   Ht 5' 7.25" (1.708 m)   Wt 241 lb (109.3 kg)   LMP 11/05/2009   SpO2 98%   BMI 37.47 kg/m  General appearance: alert, cooperative and no distress Head: Normocephalic, without obvious abnormality, atraumatic Eyes: conjunctivae/corneas clear. PERRL, EOM's intact. Fundi benign. Ears: normal TM's and external  ear canals both ears Nose: Nares normal. Septum midline. Mucosa normal. No drainage or sinus tenderness. Throat: lips, mucosa, and tongue normal; teeth and gums normal Neck: no adenopathy, no carotid bruit, no JVD, supple, symmetrical, trachea midline and thyroid not enlarged, symmetric, no tenderness/mass/nodules Lungs: clear to auscultation bilaterally Heart: regular rate and rhythm, S1, S2 normal, no murmur, click, rub or gallop Abdomen: soft, non-tender; bowel sounds normal; no masses,  no organomegaly Extremities: extremities normal, atraumatic, no cyanosis or edema Pulses: 2+ and symmetric Skin: Skin color, texture, turgor normal. No rashes or lesions Lymph nodes: Cervical, supraclavicular, and axillary nodes normal. Neurologic: Alert and oriented X 3, normal strength and tone. Normal symmetric reflexes. Normal coordination and gait    Assessment:     Healthy female exam.     Plan:     Anticipatory guidance given including wearing seatbelts, smoke detectors in the home, increasing physical activity, increasing p.o. intake of water and vegetables. -will obtain labs -Given handout See After Visit Summary for Counseling Recommendations    HTN: -Controlled -Continue current medications losartan-hydrochlorothiazide 160-12.5 mg -Continue lifestyle modification  Obesity -lifestyle modifications -increase physical activity  Hypothyroidism -continue synthroid 50 mcg   F/u prn 6 months.  Sooner if needed  Grier Mitts, MD

## 2018-11-18 NOTE — Patient Instructions (Addendum)
Health Maintenance Due  Topic Date Due  . INFLUENZA VACCINE  11/06/2018    Depression screen [facility] 2/9 11/18/2018 07/03/2016 07/03/2016  Decreased Interest 0 0 0  Down, Depressed, Hopeless 0 0 0  PHQ - 2 Score 0 0 0   Preventive Care 78-56 Years Old, Female Preventive care refers to visits with your health care provider and lifestyle choices that can promote health and wellness. This includes:  A yearly physical exam. This may also be called an annual well check.  Regular dental visits and eye exams.  Immunizations.  Screening for certain conditions.  Healthy lifestyle choices, such as eating a healthy diet, getting regular exercise, not using drugs or products that contain nicotine and tobacco, and limiting alcohol use. What can I expect for my preventive care visit? Physical exam Your health care provider will check your:  Height and weight. This may be used to calculate body mass index (BMI), which tells if you are at a healthy weight.  Heart rate and blood pressure.  Skin for abnormal spots. Counseling Your health care provider may ask you questions about your:  Alcohol, tobacco, and drug use.  Emotional well-being.  Home and relationship well-being.  Sexual activity.  Eating habits.  Work and work Statistician.  Method of birth control.  Menstrual cycle.  Pregnancy history. What immunizations do I need?  Influenza (flu) vaccine  This is recommended every year. Tetanus, diphtheria, and pertussis (Tdap) vaccine  You may need a Td booster every 10 years. Varicella (chickenpox) vaccine  You may need this if you have not been vaccinated. Zoster (shingles) vaccine  You may need this after age 45. Measles, mumps, and rubella (MMR) vaccine  You may need at least one dose of MMR if you were born in 1957 or later. You may also need a second dose. Pneumococcal conjugate (PCV13) vaccine  You may need this if you have certain conditions and were not previously  vaccinated. Pneumococcal polysaccharide (PPSV23) vaccine  You may need one or two doses if you smoke cigarettes or if you have certain conditions. Meningococcal conjugate (MenACWY) vaccine  You may need this if you have certain conditions. Hepatitis A vaccine  You may need this if you have certain conditions or if you travel or work in places where you may be exposed to hepatitis A. Hepatitis B vaccine  You may need this if you have certain conditions or if you travel or work in places where you may be exposed to hepatitis B. Haemophilus influenzae type b (Hib) vaccine  You may need this if you have certain conditions. Human papillomavirus (HPV) vaccine  If recommended by your health care provider, you may need three doses over 6 months. You may receive vaccines as individual doses or as more than one vaccine together in one shot (combination vaccines). Talk with your health care provider about the risks and benefits of combination vaccines. What tests do I need? Blood tests  Lipid and cholesterol levels. These may be checked every 5 years, or more frequently if you are over 50 years old.  Hepatitis C test.  Hepatitis B test. Screening  Lung cancer screening. You may have this screening every year starting at age 86 if you have a 30-pack-year history of smoking and currently smoke or have quit within the past 15 years.  Colorectal cancer screening. All adults should have this screening starting at age 7 and continuing until age 74. Your health care provider may recommend screening at age 59 if you  are at increased risk. You will have tests every 1-10 years, depending on your results and the type of screening test.  Diabetes screening. This is done by checking your blood sugar (glucose) after you have not eaten for a while (fasting). You may have this done every 1-3 years.  Mammogram. This may be done every 1-2 years. Talk with your health care provider about when you should start  having regular mammograms. This may depend on whether you have a family history of breast cancer.  BRCA-related cancer screening. This may be done if you have a family history of breast, ovarian, tubal, or peritoneal cancers.  Pelvic exam and Pap test. This may be done every 3 years starting at age 67. Starting at age 46, this may be done every 5 years if you have a Pap test in combination with an HPV test. Other tests  Sexually transmitted disease (STD) testing.  Bone density scan. This is done to screen for osteoporosis. You may have this scan if you are at high risk for osteoporosis. Follow these instructions at home: Eating and drinking  Eat a diet that includes fresh fruits and vegetables, whole grains, lean protein, and low-fat dairy.  Take vitamin and mineral supplements as recommended by your health care provider.  Do not drink alcohol if: ? Your health care provider tells you not to drink. ? You are pregnant, may be pregnant, or are planning to become pregnant.  If you drink alcohol: ? Limit how much you have to 0-1 drink a day. ? Be aware of how much alcohol is in your drink. In the [place] one drink equals one 12 oz bottle of beer (355 mL), one 5 oz glass of wine (148 mL), or one 1 oz glass of hard liquor (44 mL). Lifestyle  Take daily care of your teeth and gums.  Stay active. Exercise for at least 30 minutes on 5 or more days each week.  Do not use any products that contain nicotine or tobacco, such as cigarettes, e-cigarettes, and chewing tobacco. If you need help quitting, ask your health care provider.  If you are sexually active, practice safe sex. Use a condom or other form of birth control (contraception) in order to prevent pregnancy and STIs (sexually transmitted infections).  If told by your health care provider, take low-dose aspirin daily starting at age 66. What's next?  Visit your health care provider once a year for a well check visit.  Ask your health  care provider how often you should have your eyes and teeth checked.  Stay up to date on all vaccines. This information is not intended to replace advice given to you by your health care provider. Make sure you discuss any questions you have with your health care provider. Document Released: 04/20/2015 Document Revised: 12/03/2017 Document Reviewed: 12/03/2017 Elsevier Patient Education  2020 [employer] Your Hypertension Hypertension is commonly called high blood pressure. This is when the force of your blood pressing against the walls of your arteries is too strong. Arteries are blood vessels that carry blood from your heart throughout your body. Hypertension forces the heart to work harder to pump blood, and may cause the arteries to become narrow or stiff. Having untreated or uncontrolled hypertension can cause heart attack, stroke, kidney disease, and other problems. What are blood pressure readings? A blood pressure reading consists of a higher number over a lower number. Ideally, your blood pressure should be below 120/80. The first ("top") number is called  the systolic pressure. It is a measure of the pressure in your arteries as your heart beats. The second ("bottom") number is called the diastolic pressure. It is a measure of the pressure in your arteries as the heart relaxes. What does my blood pressure reading mean? Blood pressure is classified into four stages. Based on your blood pressure reading, your health care provider may use the following stages to determine what type of treatment you need, if any. Systolic pressure and diastolic pressure are measured in a unit called mm Hg. Normal  Systolic pressure: below 222.  Diastolic pressure: below 80. Elevated  Systolic pressure: 979-892.  Diastolic pressure: below 80. Hypertension stage 1  Systolic pressure: 119-417.  Diastolic pressure: 40-81. Hypertension stage 2  Systolic pressure: 448 or above.  Diastolic  pressure: 90 or above. What health risks are associated with hypertension? Managing your hypertension is an important responsibility. Uncontrolled hypertension can lead to:  A heart attack.  A stroke.  A weakened blood vessel (aneurysm).  Heart failure.  Kidney damage.  Eye damage.  Metabolic syndrome.  Memory and concentration problems. What changes can I make to manage my hypertension? Hypertension can be managed by making lifestyle changes and possibly by taking medicines. Your health care provider will help you make a plan to bring your blood pressure within a normal range. Eating and drinking   Eat a diet that is high in fiber and potassium, and low in salt (sodium), added sugar, and fat. An example eating plan is called the DASH (Dietary Approaches to Stop Hypertension) diet. To eat this way: ? Eat plenty of fresh fruits and vegetables. Try to fill half of your plate at each meal with fruits and vegetables. ? Eat whole grains, such as whole wheat pasta, brown rice, or whole grain bread. Fill about one quarter of your plate with whole grains. ? Eat low-fat diary products. ? Avoid fatty cuts of meat, processed or cured meats, and poultry with skin. Fill about one quarter of your plate with lean proteins such as fish, chicken without skin, beans, eggs, and tofu. ? Avoid premade and processed foods. These tend to be higher in sodium, added sugar, and fat.  Reduce your daily sodium intake. Most people with hypertension should eat less than 1,500 mg of sodium a day.  Limit alcohol intake to no more than 1 drink a day for nonpregnant women and 2 drinks a day for men. One drink equals 12 oz of beer, 5 oz of wine, or 1 oz of hard liquor. Lifestyle  Work with your health care provider to maintain a healthy body weight, or to lose weight. Ask what an ideal weight is for you.  Get at least 30 minutes of exercise that causes your heart to beat faster (aerobic exercise) most days of  the week. Activities may include walking, swimming, or biking.  Include exercise to strengthen your muscles (resistance exercise), such as weight lifting, as part of your weekly exercise routine. Try to do these types of exercises for 30 minutes at least 3 days a week.  Do not use any products that contain nicotine or tobacco, such as cigarettes and e-cigarettes. If you need help quitting, ask your health care provider.  Control any long-term (chronic) conditions you have, such as high cholesterol or diabetes. Monitoring  Monitor your blood pressure at home as told by your health care provider. Your personal target blood pressure may vary depending on your medical conditions, your age, and other factors.  Have  your blood pressure checked regularly, as often as told by your health care provider. Working with your health care provider  Review all the medicines you take with your health care provider because there may be side effects or interactions.  Talk with your health care provider about your diet, exercise habits, and other lifestyle factors that may be contributing to hypertension.  Visit your health care provider regularly. Your health care provider can help you create and adjust your plan for managing hypertension. Will I need medicine to control my blood pressure? Your health care provider may prescribe medicine if lifestyle changes are not enough to get your blood pressure under control, and if:  Your systolic blood pressure is 130 or higher.  Your diastolic blood pressure is 80 or higher. Take medicines only as told by your health care provider. Follow the directions carefully. Blood pressure medicines must be taken as prescribed. The medicine does not work as well when you skip doses. Skipping doses also puts you at risk for problems. Contact a health care provider if:  You think you are having a reaction to medicines you have taken.  You have repeated (recurrent)  headaches.  You feel dizzy.  You have swelling in your ankles.  You have trouble with your vision. Get help right away if:  You develop a severe headache or confusion.  You have unusual weakness or numbness, or you feel faint.  You have severe pain in your chest or abdomen.  You vomit repeatedly.  You have trouble breathing. Summary  Hypertension is when the force of blood pumping through your arteries is too strong. If this condition is not controlled, it may put you at risk for serious complications.  Your personal target blood pressure may vary depending on your medical conditions, your age, and other factors. For most people, a normal blood pressure is less than 120/80.  Hypertension is managed by lifestyle changes, medicines, or both. Lifestyle changes include weight loss, eating a healthy, low-sodium diet, exercising more, and limiting alcohol. This information is not intended to replace advice given to you by your health care provider. Make sure you discuss any questions you have with your health care provider. Document Released: 12/17/2011 Document Revised: 07/16/2018 Document Reviewed: 02/20/2016 Elsevier Patient Education  2020 [employer].  Preventing High Cholesterol Cholesterol is a white, waxy substance similar to fat that the human body needs to help build cells. The liver makes all the cholesterol that a person's body needs. Having high cholesterol (hypercholesterolemia) increases a person's risk for heart disease and stroke. Extra (excess) cholesterol comes from the food the person eats. High cholesterol can often be prevented with diet and lifestyle changes. If you already have high cholesterol, you can control it with diet and lifestyle changes and with medicine. How can high cholesterol affect me? If you have high cholesterol, deposits (plaques) may build up on the walls of your arteries. The arteries are the blood vessels that carry blood away from your  heart. Plaques make the arteries narrower and stiffer. This can limit or block blood flow and cause blood clots to form. Blood clots:  Are tiny balls of cells that form in your blood.  Can move to the heart or brain, causing a heart attack or stroke. Plaques in arteries greatly increase your risk for heart attack and stroke.Making diet and lifestyle changes can reduce your risk for these conditions that may threaten your life. What can increase my risk? This condition is more likely to develop  in people who:  Eat foods that are high in saturated fat or cholesterol. Saturated fat is mostly found in: ? Foods that contain animal fat, such as red meat and some dairy products. ? Certain fatty foods made from plants, such as tropical oils.  Are overweight.  Are not getting enough exercise.  Have a family history of high cholesterol. What actions can I take to prevent this? Nutrition   Eat less saturated fat.  Avoid trans fats (partially hydrogenated oils). These are often found in margarine and in some baked goods, fried foods, and snacks bought in packages.  Avoid precooked or cured meat, such as sausages or meat loaves.  Avoid foods and drinks that have added sugars.  Eat more fruits, vegetables, and whole grains.  Choose healthy sources of protein, such as fish, poultry, lean cuts of red meat, beans, peas, lentils, and nuts.  Choose healthy sources of fat, such as: ? Nuts. ? Vegetable oils, especially olive oil. ? Fish that have healthy fats (omega-3 fatty acids), such as mackerel or salmon. The items listed above may not be a complete list of recommended foods and beverages. Contact a dietitian for more information. Lifestyle  Lose weight if you are overweight. Losing 5-10 lb (2.3-4.5 kg) can help prevent or control high cholesterol. It can also lower your risk for diabetes and high blood pressure. Ask your health care provider to help you with a diet and exercise plan to lose  weight safely.  Do not use any products that contain nicotine or tobacco, such as cigarettes, e-cigarettes, and chewing tobacco. If you need help quitting, ask your health care provider.  Limit your alcohol intake. ? Do not drink alcohol if:  Your health care provider tells you not to drink.  You are pregnant, may be pregnant, or are planning to become pregnant. ? If you drink alcohol:  Limit how much you use to:  0-1 drink a day for women.  0-2 drinks a day for men.  Be aware of how much alcohol is in your drink. In the [place] one drink equals one 12 oz bottle of beer (355 mL), one 5 oz glass of wine (148 mL), or one 1 oz glass of hard liquor (44 mL). Activity   Get enough exercise. Each week, do at least 150 minutes of exercise that takes a medium level of effort (moderate-intensity exercise). ? This is exercise that:  Makes your heart beat faster and makes you breathe harder than usual.  Allows you to still be able to talk. ? You could exercise in short sessions several times a day or longer sessions a few times a week. For example, on 5 days each week, you could walk fast or ride your bike 3 times a day for 10 minutes each time.  Do exercises as told by your health care provider. Medicines  In addition to diet and lifestyle changes, your health care provider may recommend medicines to help lower cholesterol. This may be a medicine to lower the amount of cholesterol your liver makes. You may need medicine if: ? Diet and lifestyle changes do not lower your cholesterol enough. ? You have high cholesterol and other risk factors for heart disease or stroke.  Take over-the-counter and prescription medicines only as told by your health care provider. General information  Manage your risk factors for high cholesterol. Talk with your health care provider about all your risk factors and how to lower your risk.  Manage other conditions that you have,  such as diabetes or high blood  pressure (hypertension).  Have blood tests to check your cholesterol levels at regular points in time as told by your health care provider.  Keep all follow-up visits as told by your health care provider. This is important. Where to find more information  American Heart Association: www.heart.org  National Heart, Lung, and Blood Institute: https://wilson-eaton.com/ Summary  High cholesterol increases your risk for heart disease and stroke. By keeping your cholesterol level low, you can reduce your risk for these conditions.  High cholesterol can often be prevented with diet and lifestyle changes.  Work with your health care provider to manage your risk factors, and have your blood tested regularly. This information is not intended to replace advice given to you by your health care provider. Make sure you discuss any questions you have with your health care provider. Document Released: 04/08/2015 Document Revised: 07/16/2018 Document Reviewed: 12/01/2015 Elsevier Patient Education  2020 [employer].

## 2019-02-04 ENCOUNTER — Telehealth: Payer: Self-pay

## 2019-02-04 ENCOUNTER — Other Ambulatory Visit: Payer: Self-pay

## 2019-02-04 DIAGNOSIS — I10 Essential (primary) hypertension: Secondary | ICD-10-CM

## 2019-02-04 MED ORDER — VALSARTAN-HYDROCHLOROTHIAZIDE 160-12.5 MG PO TABS: 1.0000 | ORAL_TABLET | Freq: Every day | ORAL | 0 refills | Status: DC

## 2019-02-04 NOTE — Telephone Encounter (Signed)
Rx sent 

## 2019-02-04 NOTE — Telephone Encounter (Signed)
Copied from [place] [phone]. Topic: Quick Communication - Rx Refill/Question >> Feb 04, 2019  2:00 PM Carolyn Stare wrote: Medication valsartan-hydrochlorothiazide (DIOVAN-HCT) 160-12.5 MG tablet   Preferred Pharmacy [facility] [place]  Agent: Please be advised that RX refills may take up to 3 business days. We ask that you follow-up with your pharmacy.

## 2019-03-06 ENCOUNTER — Other Ambulatory Visit: Payer: Self-pay | Admitting: Family Medicine

## 2019-03-06 DIAGNOSIS — I10 Essential (primary) hypertension: Secondary | ICD-10-CM

## 2019-03-07 MED ORDER — VALSARTAN-HYDROCHLOROTHIAZIDE 160-12.5 MG PO TABS: 1.0000 | ORAL_TABLET | Freq: Every day | ORAL | 0 refills | Status: DC

## 2019-03-07 NOTE — Telephone Encounter (Signed)
Please patient Rx refill request

## 2019-05-02 ENCOUNTER — Other Ambulatory Visit: Payer: Self-pay | Admitting: Family Medicine

## 2019-05-02 DIAGNOSIS — I10 Essential (primary) hypertension: Secondary | ICD-10-CM

## 2019-07-31 ENCOUNTER — Other Ambulatory Visit: Payer: Self-pay | Admitting: Family Medicine

## 2019-07-31 DIAGNOSIS — I10 Essential (primary) hypertension: Secondary | ICD-10-CM

## 2019-10-07 DIAGNOSIS — Z6832 Body mass index (BMI) 32.0-32.9, adult: Secondary | ICD-10-CM | POA: Diagnosis not present

## 2019-10-07 DIAGNOSIS — Z01419 Encounter for gynecological examination (general) (routine) without abnormal findings: Secondary | ICD-10-CM | POA: Diagnosis not present

## 2019-10-24 DIAGNOSIS — Z1231 Encounter for screening mammogram for malignant neoplasm of breast: Secondary | ICD-10-CM | POA: Diagnosis not present

## 2019-10-24 LAB — HM MAMMOGRAPHY

## 2019-11-04 ENCOUNTER — Encounter: Payer: Self-pay | Admitting: Family Medicine

## 2019-11-18 DIAGNOSIS — Q438 Other specified congenital malformations of intestine: Secondary | ICD-10-CM | POA: Diagnosis not present

## 2019-11-18 DIAGNOSIS — Z8371 Family history of colonic polyps: Secondary | ICD-10-CM | POA: Diagnosis not present

## 2019-11-18 DIAGNOSIS — K5730 Diverticulosis of large intestine without perforation or abscess without bleeding: Secondary | ICD-10-CM | POA: Diagnosis not present

## 2019-11-21 ENCOUNTER — Encounter: Payer: PRIVATE HEALTH INSURANCE | Admitting: Family Medicine

## 2019-11-25 ENCOUNTER — Other Ambulatory Visit: Payer: Self-pay

## 2019-11-25 ENCOUNTER — Encounter: Payer: Self-pay | Admitting: Family Medicine

## 2019-11-25 ENCOUNTER — Ambulatory Visit (INDEPENDENT_AMBULATORY_CARE_PROVIDER_SITE_OTHER): Payer: BC Managed Care – PPO | Admitting: Family Medicine

## 2019-11-25 VITALS — BP 118/72 | HR 74 | Temp 98.0°F | Ht 67.0 in | Wt 214.0 lb

## 2019-11-25 DIAGNOSIS — I10 Essential (primary) hypertension: Secondary | ICD-10-CM | POA: Diagnosis not present

## 2019-11-25 DIAGNOSIS — E785 Hyperlipidemia, unspecified: Secondary | ICD-10-CM | POA: Diagnosis not present

## 2019-11-25 DIAGNOSIS — E039 Hypothyroidism, unspecified: Secondary | ICD-10-CM | POA: Diagnosis not present

## 2019-11-25 DIAGNOSIS — Z0000 Encounter for general adult medical examination without abnormal findings: Secondary | ICD-10-CM | POA: Diagnosis not present

## 2019-11-25 NOTE — Progress Notes (Signed)
Subjective:     Brianna [name] is a 59 y.o. female and is here for a comprehensive physical exam. The patient reports no problems.  Not checking bp at home as "feels good".  Endorses recent mammogram with OB/Gyn.  Pt states she had labs done with Dr. Johny Drilling? at [facility]?  States she sees this provider for her thyroid.  States was told her cholesterol was "not good".  Pt does not recall the numbers.  Eating "whatever" at home.  Had colonoscopy last wk.  BP was 130s systolic at time of procedure without taking her meds.  Social History   Socioeconomic History  . Marital status: Married    Spouse name: Not on file  . Number of children: Not on file  . Years of education: Not on file  . Highest education level: Not on file  Occupational History  . Not on file  Tobacco Use  . Smoking status: Former Smoker    Years: 5.00    Types: Cigarettes    Quit date: 04/07/1990    Years since quitting: 29.6  . Smokeless tobacco: Never Used  . Tobacco comment: light smoker  Substance and Sexual Activity  . Alcohol use: No    Alcohol/week: 0.0 standard drinks  . Drug use: No  . Sexual activity: Not on file  Other Topics Concern  . Not on file  Social History Narrative   Work or School: Development worker, community Situation: lives with husband      Spiritual Beliefs: Christian      Lifestyle: no regular exercise; diet is not the best      Social Determinants of Health   Financial Resource Strain:   . Difficulty of Paying Living Expenses: Not on file  Food Insecurity:   . Worried About Programme researcher, broadcasting/film/video in the Last Year: Not on file  . Ran Out of Food in the Last Year: Not on file  Transportation Needs:   . Lack of Transportation (Medical): Not on file  . Lack of Transportation (Non-Medical): Not on file  Physical Activity:   . Days of Exercise per Week: Not on file  . Minutes of Exercise per Session: Not on file  Stress:   . Feeling of Stress : Not on file  Social  Connections:   . Frequency of Communication with Friends and Family: Not on file  . Frequency of Social Gatherings with Friends and Family: Not on file  . Attends Religious Services: Not on file  . Active Member of Clubs or Organizations: Not on file  . Attends Banker Meetings: Not on file  . Marital Status: Not on file  Intimate Partner Violence:   . Fear of Current or Ex-Partner: Not on file  . Emotionally Abused: Not on file  . Physically Abused: Not on file  . Sexually Abused: Not on file   Health Maintenance  Topic Date Due  . Hepatitis C Screening  Never done  . COVID-19 Vaccine (1) Never done  . INFLUENZA VACCINE  11/06/2019  . HIV Screening  06/20/2024 (Originally 12/26/1975)  . PAP SMEAR-Modifier  08/21/2020  . MAMMOGRAM  10/23/2021  . TETANUS/TDAP  01/25/2022  . COLONOSCOPY  01/06/2024    The following portions of the patient's history were reviewed and updated as appropriate: allergies, current medications, past family history, past medical history, past social history, past surgical history and problem list.  Review of Systems Pertinent items noted in HPI and remainder of  comprehensive ROS otherwise negative.   Objective:    BP 118/72 (BP Location: Left Arm, Patient Position: Sitting, Cuff Size: Large)   Pulse 74   Temp 98 F (36.7 C) (Oral)   Ht 5\' 7"  (1.702 m)   Wt 214 lb (97.1 kg)   LMP 12/04/2017 (Exact Date)   SpO2 98%   BMI 33.52 kg/m  General appearance: alert, cooperative and no distress Head: Normocephalic, without obvious abnormality, atraumatic Eyes: conjunctivae/corneas clear. PERRL, EOM's intact. Fundi benign. Ears: normal TM's and external ear canals both ears Nose: Nares normal. Septum midline. Mucosa normal. No drainage or sinus tenderness. Throat: lips, mucosa, and tongue normal; teeth and gums normal Neck: no adenopathy, no carotid bruit, no JVD, supple, symmetrical, trachea midline and thyroid not enlarged, symmetric, no  tenderness/mass/nodules Lungs: clear to auscultation bilaterally Heart: regular rate and rhythm, S1, S2 normal, no murmur, click, rub or gallop Abdomen: soft, non-tender; bowel sounds normal; no masses,  no organomegaly Extremities: extremities normal, atraumatic, no cyanosis or edema Pulses: 2+ and symmetric Skin: Skin color, texture, turgor normal. No rashes or lesions Lymph nodes: Cervical, supraclavicular, and axillary nodes normal. Neurologic: Alert and oriented X 3, normal strength and tone. Normal symmetric reflexes. Normal coordination and gait    Assessment:    Healthy female exam.     Plan:     Anticipatory guidance given including wearing seatbelts, smoke detectors in the home, increasing physical activity, increasing p.o. intake of water and vegetables. -pt declined labs as she had recent labs with other providers.  Pt to sign records release form to obtain that info. -colonoscopy done last wk -mammogram done 2021 with OB/Gyn -pap up to date, followed by OB/Gyn -given handout -next CPE in 1 yr See After Visit Summary for Counseling Recommendations    Essential hypertension -controlled -continue valsartan-HCTZ 160-12.5 mg -discussed lifestyle modifications -Patient encouraged to obtain BP cuff to monitor blood pressure at home  Hypothyroidism, unspecified type -continue synthroid 50 mcg and cytomel 5 mcg -continue f/u with [facility]? Dr. 2022? -Patient to sign records release to obtain recent labs including TSH.  HLD, unspecified -Per patient recent labs with elevated cholesterol -Discussed records release form to obtain that information -Discussed lifestyle modifications -Repeat lipid panel if unable to obtain records.  F/u prn   Johny Drilling, MD

## 2019-11-25 NOTE — Patient Instructions (Signed)
Preventive Care 40-57 Years Old, Female Preventive care refers to visits with your health care provider and lifestyle choices that can promote health and wellness. This includes:  A yearly physical exam. This may also be called an annual well check.  Regular dental visits and eye exams.  Immunizations.  Screening for certain conditions.  Healthy lifestyle choices, such as eating a healthy diet, getting regular exercise, not using drugs or products that contain nicotine and tobacco, and limiting alcohol use. What can I expect for my preventive care visit? Physical exam Your health care provider will check your:  Height and weight. This may be used to calculate body mass index (BMI), which tells if you are at a healthy weight.  Heart rate and blood pressure.  Skin for abnormal spots. Counseling Your health care provider may ask you questions about your:  Alcohol, tobacco, and drug use.  Emotional well-being.  Home and relationship well-being.  Sexual activity.  Eating habits.  Work and work environment.  Method of birth control.  Menstrual cycle.  Pregnancy history. What immunizations do I need?  Influenza (flu) vaccine  This is recommended every year. Tetanus, diphtheria, and pertussis (Tdap) vaccine  You may need a Td booster every 10 years. Varicella (chickenpox) vaccine  You may need this if you have not been vaccinated. Zoster (shingles) vaccine  You may need this after age 60. Measles, mumps, and rubella (MMR) vaccine  You may need at least one dose of MMR if you were born in 1957 or later. You may also need a second dose. Pneumococcal conjugate (PCV13) vaccine  You may need this if you have certain conditions and were not previously vaccinated. Pneumococcal polysaccharide (PPSV23) vaccine  You may need one or two doses if you smoke cigarettes or if you have certain conditions. Meningococcal conjugate (MenACWY) vaccine  You may need this if you  have certain conditions. Hepatitis A vaccine  You may need this if you have certain conditions or if you travel or work in places where you may be exposed to hepatitis A. Hepatitis B vaccine  You may need this if you have certain conditions or if you travel or work in places where you may be exposed to hepatitis B. Haemophilus influenzae type b (Hib) vaccine  You may need this if you have certain conditions. Human papillomavirus (HPV) vaccine  If recommended by your health care provider, you may need three doses over 6 months. You may receive vaccines as individual doses or as more than one vaccine together in one shot (combination vaccines). Talk with your health care provider about the risks and benefits of combination vaccines. What tests do I need? Blood tests  Lipid and cholesterol levels. These may be checked every 5 years, or more frequently if you are over 50 years old.  Hepatitis C test.  Hepatitis B test. Screening  Lung cancer screening. You may have this screening every year starting at age 55 if you have a 30-pack-year history of smoking and currently smoke or have quit within the past 15 years.  Colorectal cancer screening. All adults should have this screening starting at age 50 and continuing until age 75. Your health care provider may recommend screening at age 45 if you are at increased risk. You will have tests every 1-10 years, depending on your results and the type of screening test.  Diabetes screening. This is done by checking your blood sugar (glucose) after you have not eaten for a while (fasting). You may have this   done every 1-3 years.  Mammogram. This may be done every 1-2 years. Talk with your health care provider about when you should start having regular mammograms. This may depend on whether you have a family history of breast cancer.  BRCA-related cancer screening. This may be done if you have a family history of breast, ovarian, tubal, or peritoneal  cancers.  Pelvic exam and Pap test. This may be done every 3 years starting at age 74. Starting at age 51, this may be done every 5 years if you have a Pap test in combination with an HPV test. Other tests  Sexually transmitted disease (STD) testing.  Bone density scan. This is done to screen for osteoporosis. You may have this scan if you are at high risk for osteoporosis. Follow these instructions at home: Eating and drinking  Eat a diet that includes fresh fruits and vegetables, whole grains, lean protein, and low-fat dairy.  Take vitamin and mineral supplements as recommended by your health care provider.  Do not drink alcohol if: ? Your health care provider tells you not to drink. ? You are pregnant, may be pregnant, or are planning to become pregnant.  If you drink alcohol: ? Limit how much you have to 0-1 drink a day. ? Be aware of how much alcohol is in your drink. In the [place] one drink equals one 12 oz bottle of beer (355 mL), one 5 oz glass of wine (148 mL), or one 1 oz glass of hard liquor (44 mL). Lifestyle  Take daily care of your teeth and gums.  Stay active. Exercise for at least 30 minutes on 5 or more days each week.  Do not use any products that contain nicotine or tobacco, such as cigarettes, e-cigarettes, and chewing tobacco. If you need help quitting, ask your health care provider.  If you are sexually active, practice safe sex. Use a condom or other form of birth control (contraception) in order to prevent pregnancy and STIs (sexually transmitted infections).  If told by your health care provider, take low-dose aspirin daily starting at age 48. What's next?  Visit your health care provider once a year for a well check visit.  Ask your health care provider how often you should have your eyes and teeth checked.  Stay up to date on all vaccines. This information is not intended to replace advice given to you by your health care provider. Make sure you  discuss any questions you have with your health care provider. Document Revised: 12/03/2017 Document Reviewed: 12/03/2017 Elsevier Patient Education  [place] Your Hypertension Hypertension is commonly called high blood pressure. This is when the force of your blood pressing against the walls of your arteries is too strong. Arteries are blood vessels that carry blood from your heart throughout your body. Hypertension forces the heart to work harder to pump blood, and may cause the arteries to become narrow or stiff. Having untreated or uncontrolled hypertension can cause heart attack, stroke, kidney disease, and other problems. What are blood pressure readings? A blood pressure reading consists of a higher number over a lower number. Ideally, your blood pressure should be below 120/80. The first ("top") number is called the systolic pressure. It is a measure of the pressure in your arteries as your heart beats. The second ("bottom") number is called the diastolic pressure. It is a measure of the pressure in your arteries as the heart relaxes. What does my blood pressure reading mean? Blood pressure  is classified into four stages. Based on your blood pressure reading, your health care provider may use the following stages to determine what type of treatment you need, if any. Systolic pressure and diastolic pressure are measured in a unit called mm Hg. Normal  Systolic pressure: below 412.  Diastolic pressure: below 80. Elevated  Systolic pressure: 878-676.  Diastolic pressure: below 80. Hypertension stage 1  Systolic pressure: 720-947.  Diastolic pressure: 09-62. Hypertension stage 2  Systolic pressure: 836 or above.  Diastolic pressure: 90 or above. What health risks are associated with hypertension? Managing your hypertension is an important responsibility. Uncontrolled hypertension can lead to:  A heart attack.  A stroke.  A weakened blood vessel  (aneurysm).  Heart failure.  Kidney damage.  Eye damage.  Metabolic syndrome.  Memory and concentration problems. What changes can I make to manage my hypertension? Hypertension can be managed by making lifestyle changes and possibly by taking medicines. Your health care provider will help you make a plan to bring your blood pressure within a normal range. Eating and drinking   Eat a diet that is high in fiber and potassium, and low in salt (sodium), added sugar, and fat. An example eating plan is called the DASH (Dietary Approaches to Stop Hypertension) diet. To eat this way: ? Eat plenty of fresh fruits and vegetables. Try to fill half of your plate at each meal with fruits and vegetables. ? Eat whole grains, such as whole wheat pasta, brown rice, or whole grain bread. Fill about one quarter of your plate with whole grains. ? Eat low-fat diary products. ? Avoid fatty cuts of meat, processed or cured meats, and poultry with skin. Fill about one quarter of your plate with lean proteins such as fish, chicken without skin, beans, eggs, and tofu. ? Avoid premade and processed foods. These tend to be higher in sodium, added sugar, and fat.  Reduce your daily sodium intake. Most people with hypertension should eat less than 1,500 mg of sodium a day.  Limit alcohol intake to no more than 1 drink a day for nonpregnant women and 2 drinks a day for men. One drink equals 12 oz of beer, 5 oz of wine, or 1 oz of hard liquor. Lifestyle  Work with your health care provider to maintain a healthy body weight, or to lose weight. Ask what an ideal weight is for you.  Get at least 30 minutes of exercise that causes your heart to beat faster (aerobic exercise) most days of the week. Activities may include walking, swimming, or biking.  Include exercise to strengthen your muscles (resistance exercise), such as weight lifting, as part of your weekly exercise routine. Try to do these types of exercises for  30 minutes at least 3 days a week.  Do not use any products that contain nicotine or tobacco, such as cigarettes and e-cigarettes. If you need help quitting, ask your health care provider.  Control any long-term (chronic) conditions you have, such as high cholesterol or diabetes. Monitoring  Monitor your blood pressure at home as told by your health care provider. Your personal target blood pressure may vary depending on your medical conditions, your age, and other factors.  Have your blood pressure checked regularly, as often as told by your health care provider. Working with your health care provider  Review all the medicines you take with your health care provider because there may be side effects or interactions.  Talk with your health care provider about your  diet, exercise habits, and other lifestyle factors that may be contributing to hypertension.  Visit your health care provider regularly. Your health care provider can help you create and adjust your plan for managing hypertension. Will I need medicine to control my blood pressure? Your health care provider may prescribe medicine if lifestyle changes are not enough to get your blood pressure under control, and if:  Your systolic blood pressure is 130 or higher.  Your diastolic blood pressure is 80 or higher. Take medicines only as told by your health care provider. Follow the directions carefully. Blood pressure medicines must be taken as prescribed. The medicine does not work as well when you skip doses. Skipping doses also puts you at risk for problems. Contact a health care provider if:  You think you are having a reaction to medicines you have taken.  You have repeated (recurrent) headaches.  You feel dizzy.  You have swelling in your ankles.  You have trouble with your vision. Get help right away if:  You develop a severe headache or confusion.  You have unusual weakness or numbness, or you feel faint.  You have  severe pain in your chest or abdomen.  You vomit repeatedly.  You have trouble breathing. Summary  Hypertension is when the force of blood pumping through your arteries is too strong. If this condition is not controlled, it may put you at risk for serious complications.  Your personal target blood pressure may vary depending on your medical conditions, your age, and other factors. For most people, a normal blood pressure is less than 120/80.  Hypertension is managed by lifestyle changes, medicines, or both. Lifestyle changes include weight loss, eating a healthy, low-sodium diet, exercising more, and limiting alcohol. This information is not intended to replace advice given to you by your health care provider. Make sure you discuss any questions you have with your health care provider. Document Revised: 07/16/2018 Document Reviewed: 02/20/2016 Elsevier Patient Education  [place].  Hypothyroidism  Hypothyroidism is when the thyroid gland does not make enough of certain hormones (it is underactive). The thyroid gland is a small gland located in the lower front part of the neck, just in front of the windpipe (trachea). This gland makes hormones that help control how the body uses food for energy (metabolism) as well as how the heart and brain function. These hormones also play a role in keeping your bones strong. When the thyroid is underactive, it produces too little of the hormones thyroxine (T4) and triiodothyronine (T3). What are the causes? This condition may be caused by:  Hashimoto's disease. This is a disease in which the body's disease-fighting system (immune system) attacks the thyroid gland. This is the most common cause.  Viral infections.  Pregnancy.  Certain medicines.  Birth defects.  Past radiation treatments to the head or neck for cancer.  Past treatment with radioactive iodine.  Past exposure to radiation in the environment.  Past surgical removal of  part or all of the thyroid.  Problems with a gland in the center of the brain (pituitary gland).  Lack of enough iodine in the diet. What increases the risk? You are more likely to develop this condition if:  You are female.  You have a family history of thyroid conditions.  You use a medicine called lithium.  You take medicines that affect the immune system (immunosuppressants). What are the signs or symptoms? Symptoms of this condition include:  Feeling as though you have no energy (  lethargy).  Not being able to tolerate cold.  Weight gain that is not explained by a change in diet or exercise habits.  Lack of appetite.  Dry skin.  Coarse hair.  Menstrual irregularity.  Slowing of thought processes.  Constipation.  Sadness or depression. How is this diagnosed? This condition may be diagnosed based on:  Your symptoms, your medical history, and a physical exam.  Blood tests. You may also have imaging tests, such as an ultrasound or MRI. How is this treated? This condition is treated with medicine that replaces the thyroid hormones that your body does not make. After you begin treatment, it may take several weeks for symptoms to go away. Follow these instructions at home:  Take over-the-counter and prescription medicines only as told by your health care provider.  If you start taking any new medicines, tell your health care provider.  Keep all follow-up visits as told by your health care provider. This is important. ? As your condition improves, your dosage of thyroid hormone medicine may change. ? You will need to have blood tests regularly so that your health care provider can monitor your condition. Contact a health care provider if:  Your symptoms do not get better with treatment.  You are taking thyroid replacement medicine and you: ? Sweat a lot. ? Have tremors. ? Feel anxious. ? Lose weight rapidly. ? Cannot tolerate heat. ? Have emotional  swings. ? Have diarrhea. ? Feel weak. Get help right away if you have:  Chest pain.  An irregular heartbeat.  A rapid heartbeat.  Difficulty breathing. Summary  Hypothyroidism is when the thyroid gland does not make enough of certain hormones (it is underactive).  When the thyroid is underactive, it produces too little of the hormones thyroxine (T4) and triiodothyronine (T3).  The most common cause is Hashimoto's disease, a disease in which the body's disease-fighting system (immune system) attacks the thyroid gland. The condition can also be caused by viral infections, medicine, pregnancy, or past radiation treatment to the head or neck.  Symptoms may include weight gain, dry skin, constipation, feeling as though you do not have energy, and not being able to tolerate cold.  This condition is treated with medicine to replace the thyroid hormones that your body does not make. This information is not intended to replace advice given to you by your health care provider. Make sure you discuss any questions you have with your health care provider. Document Revised: 03/06/2017 Document Reviewed: 03/04/2017 Elsevier Patient Education  2020 [employer].

## 2020-01-02 DIAGNOSIS — N950 Postmenopausal bleeding: Secondary | ICD-10-CM | POA: Diagnosis not present

## 2020-01-02 DIAGNOSIS — D259 Leiomyoma of uterus, unspecified: Secondary | ICD-10-CM | POA: Diagnosis not present

## 2020-01-02 DIAGNOSIS — N859 Noninflammatory disorder of uterus, unspecified: Secondary | ICD-10-CM | POA: Diagnosis not present

## 2020-01-13 DIAGNOSIS — H2513 Age-related nuclear cataract, bilateral: Secondary | ICD-10-CM | POA: Diagnosis not present

## 2020-01-13 DIAGNOSIS — H40023 Open angle with borderline findings, high risk, bilateral: Secondary | ICD-10-CM | POA: Diagnosis not present

## 2020-01-29 ENCOUNTER — Other Ambulatory Visit: Payer: Self-pay | Admitting: Family Medicine

## 2020-01-29 DIAGNOSIS — I10 Essential (primary) hypertension: Secondary | ICD-10-CM

## 2020-03-08 ENCOUNTER — Other Ambulatory Visit: Payer: Self-pay

## 2020-03-09 ENCOUNTER — Encounter: Payer: Self-pay | Admitting: Family Medicine

## 2020-03-09 ENCOUNTER — Ambulatory Visit: Payer: BC Managed Care – PPO | Admitting: Family Medicine

## 2020-03-09 VITALS — BP 124/78 | HR 97 | Temp 98.3°F | Wt 226.4 lb

## 2020-03-09 DIAGNOSIS — H6982 Other specified disorders of Eustachian tube, left ear: Secondary | ICD-10-CM

## 2020-03-09 DIAGNOSIS — H1012 Acute atopic conjunctivitis, left eye: Secondary | ICD-10-CM

## 2020-03-09 DIAGNOSIS — J302 Other seasonal allergic rhinitis: Secondary | ICD-10-CM | POA: Diagnosis not present

## 2020-03-09 MED ORDER — DESLORATADINE 5 MG PO TABS: 5.0000 mg | ORAL_TABLET | Freq: Every day | ORAL | 1 refills | Status: DC

## 2020-03-09 NOTE — Patient Instructions (Signed)
Allergic Conjunctivitis A clear membrane (conjunctiva) covers the white part of your eye and the inner surface of your eyelid. Allergic conjunctivitis happens when this membrane has inflammation. This is caused by allergies. Common causes of allergic reactions (allergens) include:  Outdoor allergens, such as: ? Pollen. ? Grass and weeds. ? Mold spores.  Indoor allergens, such as: ? Dust. ? Smoke. ? Mold. ? Pet dander. ? Animal hair. This condition can make your eye red or pink. It can also make your eye feel itchy. This condition cannot be spread from one person to another person (is not contagious). Follow these instructions at home:  Try not to be around things that you are allergic to.  Take or apply over-the-counter and prescription medicines only as told by your doctor. These include any eye drops.  Place a cool, clean washcloth on your eye for 10-20 minutes. Do this 3-4 times a day.  Do not touch or rub your eyes.  Do not wear contact lenses until the inflammation is gone. Wear glasses instead.  Do not wear eye makeup until the inflammation is gone.  Keep all follow-up visits as told by your doctor. This is important. Contact a doctor if:  Your symptoms get worse.  Your symptoms do not get better with treatment.  You have mild eye pain.  You are sensitive to light,  You have spots or blisters on your eyes.  You have pus coming from your eye.  You have a fever. Get help right away if:  You have redness, swelling, or other symptoms in only one eye.  Your vision is blurry.  You have vision changes.  You have very bad eye pain. Summary  Allergic conjunctivitis is caused by allergies. It can make your eye red or pink, and it can make your eye feel itchy.  This condition cannot be spread from one person to another person (is not contagious).  Try not to be around things that you are allergic to.  Take or apply over-the-counter and prescription medicines  only as told by your doctor. These include any eye drops.  Contact your doctor if your symptoms get worse or they do not get better with treatment. This information is not intended to replace advice given to you by your health care provider. Make sure you discuss any questions you have with your health care provider. Document Revised: 07/13/2018 Document Reviewed: 11/16/2015 Elsevier Patient Education  2020 [employer]  Eustachian Tube Dysfunction  Eustachian tube dysfunction refers to a condition in which a blockage develops in the narrow passage that connects the middle ear to the back of the nose (eustachian tube). The eustachian tube regulates air pressure in the middle ear by letting air move between the ear and nose. It also helps to drain fluid from the middle ear space. Eustachian tube dysfunction can affect one or both ears. When the eustachian tube does not function properly, air pressure, fluid, or both can build up in the middle ear. What are the causes? This condition occurs when the eustachian tube becomes blocked or cannot open normally. Common causes of this condition include:  Ear infections.  Colds and other infections that affect the nose, mouth, and throat (upper respiratory tract).  Allergies.  Irritation from cigarette smoke.  Irritation from stomach acid coming up into the esophagus (gastroesophageal reflux). The esophagus is the tube that carries food from the mouth to the stomach.  Sudden changes in air pressure, such as from descending in an airplane or scuba  diving.  Abnormal growths in the nose or throat, such as: ? Growths that line the nose (nasal polyps). ? Abnormal growth of cells (tumors). ? Enlarged tissue at the back of the throat (adenoids). What increases the risk? You are more likely to develop this condition if:  You smoke.  You are overweight.  You are a child who has: ? Certain birth defects of the mouth, such as cleft palate. ? Large  tonsils or adenoids. What are the signs or symptoms? Common symptoms of this condition include:  A feeling of fullness in the ear.  Ear pain.  Clicking or popping noises in the ear.  Ringing in the ear.  Hearing loss.  Loss of balance.  Dizziness. Symptoms may get worse when the air pressure around you changes, such as when you travel to an area of high elevation, fly on an airplane, or go scuba diving. How is this diagnosed? This condition may be diagnosed based on:  Your symptoms.  A physical exam of your ears, nose, and throat.  Tests, such as those that measure: ? The movement of your eardrum (tympanogram). ? Your hearing (audiometry). How is this treated? Treatment depends on the cause and severity of your condition.  In mild cases, you may relieve your symptoms by moving air into your ears. This is called "popping the ears."  In more severe cases, or if you have symptoms of fluid in your ears, treatment may include: ? Medicines to relieve congestion (decongestants). ? Medicines that treat allergies (antihistamines). ? Nasal sprays or ear drops that contain medicines that reduce swelling (steroids). ? A procedure to drain the fluid in your eardrum (myringotomy). In this procedure, a small tube is placed in the eardrum to:  Drain the fluid.  Restore the air in the middle ear space. ? A procedure to insert a balloon device through the nose to inflate the opening of the eustachian tube (balloon dilation). Follow these instructions at home: Lifestyle  Do not do any of the following until your health care provider approves: ? Travel to high altitudes. ? Fly in airplanes. ? Work in a Estate agent or room. ? Scuba dive.  Do not use any products that contain nicotine or tobacco, such as cigarettes and e-cigarettes. If you need help quitting, ask your health care provider.  Keep your ears dry. Wear fitted earplugs during showering and bathing. Dry your ears  completely after. General instructions  Take over-the-counter and prescription medicines only as told by your health care provider.  Use techniques to help pop your ears as recommended by your health care provider. These may include: ? Chewing gum. ? Yawning. ? Frequent, forceful swallowing. ? Closing your mouth, holding your nose closed, and gently blowing as if you are trying to blow air out of your nose.  Keep all follow-up visits as told by your health care provider. This is important. Contact a health care provider if:  Your symptoms do not go away after treatment.  Your symptoms come back after treatment.  You are unable to pop your ears.  You have: ? A fever. ? Pain in your ear. ? Pain in your head or neck. ? Fluid draining from your ear.  Your hearing suddenly changes.  You become very dizzy.  You lose your balance. Summary  Eustachian tube dysfunction refers to a condition in which a blockage develops in the eustachian tube.  It can be caused by ear infections, allergies, inhaled irritants, or abnormal growths in the nose  or throat.  Symptoms include ear pain, hearing loss, or ringing in the ears.  Mild cases are treated with maneuvers to unblock the ears, such as yawning or ear popping.  Severe cases are treated with medicines. Surgery may also be done (rare). This information is not intended to replace advice given to you by your health care provider. Make sure you discuss any questions you have with your health care provider. Document Revised: 07/14/2017 Document Reviewed: 07/14/2017 Elsevier Patient Education  2020 [employer].

## 2020-03-23 ENCOUNTER — Encounter: Payer: Self-pay | Admitting: Family Medicine

## 2020-03-23 NOTE — Progress Notes (Signed)
Subjective:    Patient ID: Brianna [name], female    DOB: 1961-01-21, 59 y.o.   MRN: [id number]  No chief complaint on file.   HPI Patient was seen today for an acute concerns.  Patient endorses left ear pain x2 weeks, left eye dryness x2 days, and throat dry and scratchy x1 day.  Patient also endorses mild discomfort in the head.  Try Xyzal for symptoms but not helping.  Past Medical History:  Diagnosis Date  . Allergy    allergic rhinitis-- Dr Willa Rough, allergist  . Hypertension   . Infertility, female   . Thyroid disease    hypothyroidism    Allergies  Allergen Reactions  . Ciprofloxacin Other (See Comments)    Muscle pain  . Penicillins     REACTION: Rash  . Procaine Hcl     REACTION: Rash    ROS General: Denies fever, chills, night sweats, changes in weight, changes in appetite HEENT: Denies changes in vision, rhinorrhea   +L eye irritation, L ear pain, and sore throat CV: Denies CP, palpitations, SOB, orthopnea Pulm: Denies SOB, cough, wheezing GI: Denies abdominal pain, nausea, vomiting, diarrhea, constipation GU: Denies dysuria, hematuria, frequency, vaginal discharge Msk: Denies muscle cramps, joint pains Neuro: Denies weakness, numbness, tingling Skin: Denies rashes, bruising Psych: Denies depression, anxiety, hallucinations     Objective:    Blood pressure 124/78, pulse 97, temperature 98.3 F (36.8 C), temperature source Oral, weight 226 lb 6.4 oz (102.7 kg), SpO2 98 %.   Gen. Pleasant, well-nourished, in no distress, normal affect   HEENT: [place]/AT, face symmetric, conjunctiva clear, no scleral icterus, PERRLA, EOMI, nares patent without drainage, pharynx without erythema or exudate.  TMs full b/l. Neck: No JVD, no thyromegaly, cervical lymphadenopathy. Lungs: no accessory muscle use, CTAB, no wheezes or rales Cardiovascular: RRR, no m/r/g, no peripheral edema Musculoskeletal: No deformities, no cyanosis or clubbing, normal tone Neuro:  A&Ox3, CN II-XII  intact, normal gait Skin:  Warm, no lesions/ rash   Wt Readings from Last 3 Encounters:  03/09/20 226 lb 6.4 oz (102.7 kg)  11/25/19 214 lb (97.1 kg)  11/18/18 241 lb (109.3 kg)    Lab Results  Component Value Date   WBC 5.0 11/18/2018   HGB 13.8 11/18/2018   HCT 40.9 11/18/2018   PLT 317.0 11/18/2018   GLUCOSE 97 11/18/2018   CHOL 177 11/18/2018   TRIG 109.0 11/18/2018   HDL 39.10 11/18/2018   LDLCALC 116 (H) 11/18/2018   ALT 29 01/07/2012   AST 39 (H) 01/07/2012   NA 139 11/18/2018   K 4.2 11/18/2018   CL 101 11/18/2018   CREATININE 0.74 11/18/2018   BUN 11 11/18/2018   CO2 30 11/18/2018   TSH 2.99 05/25/2018   HGBA1C 5.8 01/14/2018    Assessment/Plan:  Seasonal allergies -will d/c Xyzal is not helping -Okay to continue Flovent, Flonase, Singulair -Will send in Rx for Clarinex - Plan: desloratadine (CLARINEX) 5 MG tablet  Eustachian tube dysfunction, left -Continue regular allergy medications including Flovent, Flonase, Singulair -We will DC Xyzal as not working. - Plan: desloratadine (CLARINEX) 5 MG tablet  Allergic conjunctivitis of left eye -Given handout -Continue allergy medications such as Singulair and Flonase. Consider OTC allergy eyedrops if needed  F/u as needed  Abbe Amsterdam, MD

## 2020-03-26 DIAGNOSIS — N950 Postmenopausal bleeding: Secondary | ICD-10-CM | POA: Diagnosis not present

## 2020-03-26 DIAGNOSIS — N83201 Unspecified ovarian cyst, right side: Secondary | ICD-10-CM | POA: Diagnosis not present

## 2020-03-26 DIAGNOSIS — D259 Leiomyoma of uterus, unspecified: Secondary | ICD-10-CM | POA: Diagnosis not present

## 2020-04-11 ENCOUNTER — Encounter: Payer: Self-pay | Admitting: Family Medicine

## 2020-04-11 ENCOUNTER — Ambulatory Visit: Payer: BC Managed Care – PPO | Admitting: Family Medicine

## 2020-04-11 ENCOUNTER — Other Ambulatory Visit: Payer: Self-pay

## 2020-04-11 VITALS — BP 118/78 | HR 94 | Temp 98.2°F | Wt 231.6 lb

## 2020-04-11 DIAGNOSIS — I10 Essential (primary) hypertension: Secondary | ICD-10-CM

## 2020-04-11 NOTE — Progress Notes (Signed)
Subjective:    Patient ID: Brianna [name], female    DOB: [birth year], 60 y.o.   MRN: [id number]  No chief complaint on file.   HPI Patient was seen today for f/u on HTN.  PT states she has been doing well on valsartan-hydrochlorothiazide 160-12.5 mg daily.  Patient states BP controlled at home.  Insurance company is increasing her co-pay for the medication.  Past Medical History:  Diagnosis Date  . Allergy    allergic rhinitis-- Dr Willa Rough, allergist  . Hypertension   . Infertility, female   . Thyroid disease    hypothyroidism    Allergies  Allergen Reactions  . Ciprofloxacin Other (See Comments)    Muscle pain  . Penicillins     REACTION: Rash  . Procaine Hcl     REACTION: Rash    ROS General: Denies fever, chills, night sweats, changes in weight, changes in appetite HEENT: Denies headaches, ear pain, changes in vision, rhinorrhea, sore throat CV: Denies CP, palpitations, SOB, orthopnea Pulm: Denies SOB, cough, wheezing GI: Denies abdominal pain, nausea, vomiting, diarrhea, constipation GU: Denies dysuria, hematuria, frequency, vaginal discharge Msk: Denies muscle cramps, joint pains Neuro: Denies weakness, numbness, tingling Skin: Denies rashes, bruising Psych: Denies depression, anxiety, hallucinations      Objective:    Blood pressure 118/78, pulse 94, temperature 98.2 F (36.8 C), temperature source Oral, weight 231 lb 9.6 oz (105.1 kg), SpO2 97 %.  Gen. Pleasant, well-nourished, in no distress, normal affect   HEENT: [place]/AT, face symmetric, conjunctiva clear, no scleral icterus, PERRLA, EOMI, nares patent without drainage Lungs: no accessory muscle use Cardiovascular: RRR, no peripheral edema Musculoskeletal: No deformities, no cyanosis or clubbing, normal tone Neuro:  A&Ox3, CN II-XII intact, normal gait Skin:  Warm, no lesions/ rash   Wt Readings from Last 3 Encounters:  04/11/20 231 lb 9.6 oz (105.1 kg)  03/09/20 226 lb 6.4 oz (102.7 kg)  11/25/19 214  lb (97.1 kg)    Lab Results  Component Value Date   WBC 5.0 11/18/2018   HGB 13.8 11/18/2018   HCT 40.9 11/18/2018   PLT 317.0 11/18/2018   GLUCOSE 97 11/18/2018   CHOL 177 11/18/2018   TRIG 109.0 11/18/2018   HDL 39.10 11/18/2018   LDLCALC 116 (H) 11/18/2018   ALT 29 01/07/2012   AST 39 (H) 01/07/2012   NA 139 11/18/2018   K 4.2 11/18/2018   CL 101 11/18/2018   CREATININE 0.74 11/18/2018   BUN 11 11/18/2018   CO2 30 11/18/2018   TSH 2.99 05/25/2018   HGBA1C 5.8 01/14/2018    Assessment/Plan:  Essential hypertension -Controlled -Continue valsartan-hydrochlorothiazide 160-12.5 mg daily -Continue lifestyle modifications -Continue checking BP at home and keep a log to bring to clinic  F/u in 4-6 months, sooner if needed  Abbe Amsterdam, MD

## 2020-05-04 ENCOUNTER — Other Ambulatory Visit: Payer: Self-pay | Admitting: Family Medicine

## 2020-05-04 DIAGNOSIS — J302 Other seasonal allergic rhinitis: Secondary | ICD-10-CM

## 2020-05-04 DIAGNOSIS — H6982 Other specified disorders of Eustachian tube, left ear: Secondary | ICD-10-CM

## 2020-05-07 DIAGNOSIS — N950 Postmenopausal bleeding: Secondary | ICD-10-CM | POA: Diagnosis not present

## 2020-05-31 DIAGNOSIS — N840 Polyp of corpus uteri: Secondary | ICD-10-CM | POA: Diagnosis not present

## 2020-05-31 DIAGNOSIS — N950 Postmenopausal bleeding: Secondary | ICD-10-CM | POA: Diagnosis not present

## 2020-07-31 ENCOUNTER — Other Ambulatory Visit: Payer: Self-pay | Admitting: Family Medicine

## 2020-07-31 DIAGNOSIS — I10 Essential (primary) hypertension: Secondary | ICD-10-CM

## 2020-11-13 DIAGNOSIS — Z1231 Encounter for screening mammogram for malignant neoplasm of breast: Secondary | ICD-10-CM | POA: Diagnosis not present

## 2020-11-13 DIAGNOSIS — Z6836 Body mass index (BMI) 36.0-36.9, adult: Secondary | ICD-10-CM | POA: Diagnosis not present

## 2020-11-13 DIAGNOSIS — Z01419 Encounter for gynecological examination (general) (routine) without abnormal findings: Secondary | ICD-10-CM | POA: Diagnosis not present

## 2020-11-30 DIAGNOSIS — J4530 Mild persistent asthma, uncomplicated: Secondary | ICD-10-CM | POA: Diagnosis not present

## 2020-11-30 DIAGNOSIS — J3089 Other allergic rhinitis: Secondary | ICD-10-CM | POA: Diagnosis not present

## 2020-11-30 DIAGNOSIS — J301 Allergic rhinitis due to pollen: Secondary | ICD-10-CM | POA: Diagnosis not present

## 2020-11-30 DIAGNOSIS — H1045 Other chronic allergic conjunctivitis: Secondary | ICD-10-CM | POA: Diagnosis not present

## 2021-01-12 DIAGNOSIS — H40023 Open angle with borderline findings, high risk, bilateral: Secondary | ICD-10-CM | POA: Diagnosis not present

## 2021-01-12 DIAGNOSIS — H2513 Age-related nuclear cataract, bilateral: Secondary | ICD-10-CM | POA: Diagnosis not present

## 2021-02-12 DIAGNOSIS — Z23 Encounter for immunization: Secondary | ICD-10-CM | POA: Diagnosis not present

## 2021-04-10 ENCOUNTER — Other Ambulatory Visit: Payer: Self-pay | Admitting: Family Medicine

## 2021-04-10 DIAGNOSIS — I10 Essential (primary) hypertension: Secondary | ICD-10-CM

## 2021-04-19 ENCOUNTER — Encounter: Payer: Self-pay | Admitting: Family Medicine

## 2021-04-19 ENCOUNTER — Ambulatory Visit (INDEPENDENT_AMBULATORY_CARE_PROVIDER_SITE_OTHER): Payer: BC Managed Care – PPO | Admitting: Family Medicine

## 2021-04-19 VITALS — BP 124/78 | HR 88 | Temp 98.3°F | Ht 67.0 in | Wt 240.8 lb

## 2021-04-19 DIAGNOSIS — R7303 Prediabetes: Secondary | ICD-10-CM

## 2021-04-19 DIAGNOSIS — E039 Hypothyroidism, unspecified: Secondary | ICD-10-CM | POA: Diagnosis not present

## 2021-04-19 DIAGNOSIS — Z23 Encounter for immunization: Secondary | ICD-10-CM | POA: Diagnosis not present

## 2021-04-19 DIAGNOSIS — Z1159 Encounter for screening for other viral diseases: Secondary | ICD-10-CM

## 2021-04-19 DIAGNOSIS — Z0000 Encounter for general adult medical examination without abnormal findings: Secondary | ICD-10-CM | POA: Diagnosis not present

## 2021-04-19 DIAGNOSIS — I10 Essential (primary) hypertension: Secondary | ICD-10-CM

## 2021-04-19 DIAGNOSIS — E782 Mixed hyperlipidemia: Secondary | ICD-10-CM

## 2021-04-19 LAB — CBC WITH DIFFERENTIAL/PLATELET
Basophils Absolute: 0 10*3/uL (ref 0.0–0.1)
Basophils Relative: 0.7 % (ref 0.0–3.0)
Eosinophils Absolute: 0.1 10*3/uL (ref 0.0–0.7)
Eosinophils Relative: 1.7 % (ref 0.0–5.0)
HCT: 43.2 % (ref 36.0–46.0)
Hemoglobin: 14.4 g/dL (ref 12.0–15.0)
Lymphocytes Relative: 41 % (ref 12.0–46.0)
Lymphs Abs: 1.8 10*3/uL (ref 0.7–4.0)
MCHC: 33.3 g/dL (ref 30.0–36.0)
MCV: 90.6 fl (ref 78.0–100.0)
Monocytes Absolute: 0.6 10*3/uL (ref 0.1–1.0)
Monocytes Relative: 12.9 % — ABNORMAL HIGH (ref 3.0–12.0)
Neutro Abs: 1.9 10*3/uL (ref 1.4–7.7)
Neutrophils Relative %: 43.7 % (ref 43.0–77.0)
Platelets: 325 10*3/uL (ref 150.0–400.0)
RBC: 4.76 Mil/uL (ref 3.87–5.11)
RDW: 13 % (ref 11.5–15.5)
WBC: 4.4 10*3/uL (ref 4.0–10.5)

## 2021-04-19 LAB — LIPID PANEL
Cholesterol: 179 mg/dL (ref 0–200)
HDL: 41.7 mg/dL (ref >39.00)
LDL Cholesterol: 120 mg/dL — ABNORMAL HIGH (ref 0–99)
NonHDL: 137.59
Total CHOL/HDL Ratio: 4
Triglycerides: 90 mg/dL (ref 0.0–149.0)
VLDL: 18 mg/dL (ref 0.0–40.0)

## 2021-04-19 LAB — BASIC METABOLIC PANEL
BUN: 13 mg/dL (ref 6–23)
CO2: 30 mEq/L (ref 19–32)
Calcium: 9.7 mg/dL (ref 8.4–10.5)
Chloride: 102 mEq/L (ref 96–112)
Creatinine, Ser: 0.73 mg/dL (ref 0.40–1.20)
GFR: 90.72 mL/min (ref >60.00)
Glucose, Bld: 97 mg/dL (ref 70–99)
Potassium: 3.9 mEq/L (ref 3.5–5.1)
Sodium: 141 mEq/L (ref 135–145)

## 2021-04-19 LAB — TSH: TSH: 2.72 u[IU]/mL (ref 0.35–5.50)

## 2021-04-19 LAB — HEMOGLOBIN A1C: Hgb A1c MFr Bld: 6.2 % (ref 4.6–6.5)

## 2021-04-19 NOTE — Progress Notes (Signed)
Subjective:     Brianna [name] is a 61 y.o. female and is here for a comprehensive physical exam. The patient reports doing well overall.  Started going to the gym to exercise.  Taking cooking classes.  Inquires about influenza vaccine.  Had Pap and mammogram on 11/13/2020 with OB/GYN.  Social History   Socioeconomic History   Marital status: Married    Spouse name: Not on file   Number of children: Not on file   Years of education: Not on file   Highest education level: Not on file  Occupational History   Not on file  Tobacco Use   Smoking status: Former    Years: 5.00    Types: Cigarettes    Quit date: 04/07/1990    Years since quitting: 31.0   Smokeless tobacco: Never   Tobacco comments:    light smoker  Substance and Sexual Activity   Alcohol use: No    Alcohol/week: 0.0 standard drinks   Drug use: No   Sexual activity: Not on file  Other Topics Concern   Not on file  Social History Narrative   Work or School: Audiological scientist       Home Situation: lives with husband      Spiritual Beliefs: Christian      Lifestyle: no regular exercise; diet is not the best      Social Determinants of Health   Financial Resource Strain: Not on file  Food Insecurity: Not on file  Transportation Needs: Not on file  Physical Activity: Not on file  Stress: Not on file  Social Connections: Not on file  Intimate Partner Violence: Not on file   Health Maintenance  Topic Date Due   Hepatitis C Screening  Never done   Zoster Vaccines- Shingrix (1 of 2) Never done   PAP SMEAR-Modifier  08/21/2020   HIV Screening  06/20/2024 (Originally 12/26/1975)   TETANUS/TDAP  01/25/2022   MAMMOGRAM  11/14/2022   COLONOSCOPY (Pts 45-50yrs Insurance coverage will need to be confirmed)  01/06/2024   INFLUENZA VACCINE  Completed   COVID-19 Vaccine  Completed   Pneumococcal Vaccine 91-39 Years old  Aged Out   HPV VACCINES  Aged Out    The following portions of the patient's history were reviewed and  updated as appropriate: allergies, current medications, past family history, past medical history, past social history, past surgical history, and problem list.  Review of Systems Pertinent items noted in HPI and remainder of comprehensive ROS otherwise negative.   Objective:    BP 124/78 (BP Location: Left Arm, Patient Position: Sitting, Cuff Size: Large)    Pulse 88    Temp 98.3 F (36.8 C) (Oral)    Ht 5\' 7"  (1.702 m)    Wt 240 lb 12.8 oz (109.2 kg)    SpO2 95%    BMI 37.71 kg/m  General appearance: alert, cooperative, and no distress Head: Normocephalic, without obvious abnormality, atraumatic Eyes: conjunctivae/corneas clear. PERRL, EOM's intact. Fundi benign. Ears: normal TM's and external ear canals both ears Nose: Nares normal. Septum midline. Mucosa normal. No drainage or sinus tenderness. Throat: lips, mucosa, and tongue normal; teeth and gums normal Neck: no adenopathy, no carotid bruit, no JVD, supple, symmetrical, trachea midline, and thyroid not enlarged, symmetric, no tenderness/mass/nodules Lungs: clear to auscultation bilaterally Heart: regular rate and rhythm, S1, S2 normal, no murmur, click, rub or gallop Abdomen: soft, non-tender; bowel sounds normal; no masses,  no organomegaly Extremities: extremities normal, atraumatic, no cyanosis or edema Pulses:  2+ and symmetric Skin: Skin color, texture, turgor normal. No rashes or lesions Lymph nodes: Cervical, supraclavicular, and axillary nodes normal. Neurologic: Alert and oriented X 3, normal strength and tone. Normal symmetric reflexes. Normal coordination and gait    Assessment:    Healthy female exam.      Plan:   Anticipatory guidance given including wearing seatbelts, smoke detectors in the home, increasing physical activity, increasing p.o. intake of water and vegetables. -labs -pap up to date done 11/13/2020 with OB/GYN -Colonoscopy done 01/05/2014 -Mammogram up-to-date done 11/13/2020 -Immunizations  reviewed -Given handout -Next CPE 1 year See After Visit Summary for Counseling Recommendations   Need for influenza vaccination  - Plan: Flu Vaccine QUAD 6+ mos PF IM (Fluarix Quad PF)  Essential hypertension -Controlled -Continue valsartan-hydrochlorothiazide 160-12.5 mg daily  - Plan: Basic metabolic panel  Mixed hyperlipidemia -Lifestyle modifications  - Plan: Lipid panel  Hypothyroidism, unspecified type -Continue Synthroid 50 mcg and Cytomel 5 mcg daily  - Plan: TSH  Prediabetes -Lifestyle modifications  - Plan: Hemoglobin A1c  Encounter for hepatitis C screening test for low risk patient  - Plan: Hep C Antibody  F/u prn  Abbe Amsterdam, MD

## 2021-04-22 LAB — HEPATITIS C ANTIBODY
Hepatitis C Ab: NONREACTIVE
SIGNAL TO CUT-OFF: 0.09 (ref <1.00)

## 2021-07-09 ENCOUNTER — Other Ambulatory Visit: Payer: Self-pay | Admitting: Family Medicine

## 2021-07-09 DIAGNOSIS — I10 Essential (primary) hypertension: Secondary | ICD-10-CM

## 2021-08-20 DIAGNOSIS — E038 Other specified hypothyroidism: Secondary | ICD-10-CM | POA: Diagnosis not present

## 2021-09-19 ENCOUNTER — Ambulatory Visit: Payer: BC Managed Care – PPO | Admitting: Family Medicine

## 2021-09-19 ENCOUNTER — Encounter: Payer: Self-pay | Admitting: Family Medicine

## 2021-09-19 VITALS — BP 144/80 | HR 90 | Temp 98.0°F | Ht 67.0 in | Wt 247.4 lb

## 2021-09-19 DIAGNOSIS — R0683 Snoring: Secondary | ICD-10-CM

## 2021-09-19 DIAGNOSIS — I10 Essential (primary) hypertension: Secondary | ICD-10-CM

## 2021-09-19 DIAGNOSIS — M21611 Bunion of right foot: Secondary | ICD-10-CM | POA: Diagnosis not present

## 2021-09-19 DIAGNOSIS — M79671 Pain in right foot: Secondary | ICD-10-CM | POA: Diagnosis not present

## 2021-09-19 MED ORDER — PREDNISONE 10 MG PO TABS: ORAL_TABLET | ORAL | 0 refills | Status: DC

## 2021-09-19 NOTE — Progress Notes (Signed)
Subjective:    Patient ID: Brianna [name], female    DOB: [birth year], 61 y.o.   MRN: [id number]  Chief Complaint  Patient presents with   Foot Pain    Patient complains of right foot pain, x5 days, Patient denies any injury, Tried Tylenol and Alleve with little relief     HPI Patient was seen today for acute concern.  Patient endorses foot pain and edema starting Friday of last week (~6d ago).  Patient states edema was across top of foot/toes as well as heels and ankles.  Patient states she was eating seafood including shrimp prior to symptoms starting.  Patient states similar symptoms have occurred in the past but this episode has lasted longer.  Tried Aleve for symptoms.  Typically wears comfortable shoes such as alagreias.  Patient has bunions.    Patient notes being told she snores at night.  On BP meds. Past Medical History:  Diagnosis Date   Allergy    allergic rhinitis-- Dr Willa Rough, allergist   Hypertension    Infertility, female    Thyroid disease    hypothyroidism    Allergies  Allergen Reactions   Ciprofloxacin Other (See Comments)    Muscle pain   Penicillins     REACTION: Rash   Procaine Hcl     REACTION: Rash    ROS General: Denies fever, chills, night sweats, changes in weight, changes in appetite HEENT: Denies headaches, ear pain, changes in vision, rhinorrhea, sore throat CV: Denies CP, palpitations, SOB, orthopnea Pulm: Denies SOB, cough, wheezing GI: Denies abdominal pain, nausea, vomiting, diarrhea, constipation GU: Denies dysuria, hematuria, frequency, vaginal discharge Msk: Denies muscle cramps, joint pains  + foot pain and edema Neuro: Denies weakness, numbness, tingling Skin: Denies rashes, bruising + bunions Psych: Denies depression, anxiety, hallucinations     Objective:    Blood pressure (!) 144/80, pulse 90, temperature 98 F (36.7 C), temperature source Oral, height 5\' 7"  (1.702 m), weight 247 lb 6.4 oz (112.2 kg), SpO2 99 %.   Gen.  Pleasant, well-nourished, in no distress, normal affect   HEENT: [place]/AT, face symmetric, conjunctiva clear, no scleral icterus, PERRLA, EOMI, nares patent without drainage Lungs: no accessory muscle use, CTAB, no wheezes or rales Cardiovascular: RRR, no m/r/g, no peripheral edema Musculoskeletal: Mild TTP of R foot across MTP jts.  No deformities, no cyanosis or clubbing, normal tone Neuro:  A&Ox3, CN II-XII intact, normal gait Skin:  Warm, no lesions/ rash.  R foot with mild edema, no erythema.  Bunions bilaterally.   Wt Readings from Last 3 Encounters:  09/19/21 247 lb 6.4 oz (112.2 kg)  04/19/21 240 lb 12.8 oz (109.2 kg)  04/11/20 231 lb 9.6 oz (105.1 kg)    Lab Results  Component Value Date   WBC 4.4 04/19/2021   HGB 14.4 04/19/2021   HCT 43.2 04/19/2021   PLT 325.0 04/19/2021   GLUCOSE 97 04/19/2021   CHOL 179 04/19/2021   TRIG 90.0 04/19/2021   HDL 41.70 04/19/2021   LDLCALC 120 (H) 04/19/2021   ALT 29 01/07/2012   AST 39 (H) 01/07/2012   NA 141 04/19/2021   K 3.9 04/19/2021   CL 102 04/19/2021   CREATININE 0.73 04/19/2021   BUN 13 04/19/2021   CO2 30 04/19/2021   TSH 2.72 04/19/2021   HGBA1C 6.2 04/19/2021    Assessment/Plan:  Right foot pain -Discussed possible causes including likely gout flare due to increased seafood intake, arthritis, sprain -Continue supportive care including rest, elevation, heat,  ice, Tylenol or NSAIDs as needed etc. -We will start prednisone taper to decrease inflammation. -Discussed limiting utility in obtaining uric acid level at this point as may not be elevated. -follow-up for continued or worsening symptoms.  Consider labs and imaging at that time if needed  - Plan: predniSONE (DELTASONE) 10 MG tablet  Bunion of great toe of right foot -Discussed treatment options -Continue to wear supportive shoes with plenty of limited toebox. -We will place referral to podiatry if patient interested in surgery in the future.  Snoring  -  Plan: Ambulatory referral to Sleep Studies  Essential hypertension -elevated, possibly 2/2 pain -Recheck -The importance of lifestyle modifications and reducing sodium intake -We will obtain sleep study -Continue current medications including valsartan-hydrochlorothiazide 160-12.5 mg daily -Continue checking BP at home and keeping a log of blood pressure. -For continued BP elevation greater than 140/90 consistently can increase medication.  F/u as needed  Abbe Amsterdam, MD

## 2021-11-13 ENCOUNTER — Ambulatory Visit (INDEPENDENT_AMBULATORY_CARE_PROVIDER_SITE_OTHER): Payer: BC Managed Care – PPO | Admitting: Neurology

## 2021-11-13 ENCOUNTER — Encounter: Payer: Self-pay | Admitting: Neurology

## 2021-11-13 VITALS — BP 127/73 | HR 81 | Ht 67.0 in | Wt 247.0 lb

## 2021-11-13 DIAGNOSIS — E669 Obesity, unspecified: Secondary | ICD-10-CM

## 2021-11-13 DIAGNOSIS — G4719 Other hypersomnia: Secondary | ICD-10-CM

## 2021-11-13 DIAGNOSIS — R0681 Apnea, not elsewhere classified: Secondary | ICD-10-CM

## 2021-11-13 DIAGNOSIS — G4730 Sleep apnea, unspecified: Secondary | ICD-10-CM

## 2021-11-13 DIAGNOSIS — R635 Abnormal weight gain: Secondary | ICD-10-CM

## 2021-11-13 DIAGNOSIS — Z820 Family history of epilepsy and other diseases of the nervous system: Secondary | ICD-10-CM

## 2021-11-13 DIAGNOSIS — R0683 Snoring: Secondary | ICD-10-CM

## 2021-11-13 DIAGNOSIS — R0689 Other abnormalities of breathing: Secondary | ICD-10-CM

## 2021-11-13 NOTE — Progress Notes (Signed)
Subjective:    [name] ID: Brianna [name] is a 61 y.o. female.  HPI    Brianna Foley, MD, PhD [facility] [address] [address] [place], [place] [address]  Dear Dr. Salomon [name],   I saw your [name], Brianna [name], upon your kind request in my sleep clinic today for initial consultation of her sleep disorder, in particular, concern for underlying obstructive sleep apnea.  Brianna [name] is unaccompanied today.  As you know, Brianna [name] is a 61 year old right-handed woman with an underlying medical history of allergies, thyroid disease, hypertension and obesity, who reports snoring and excessive daytime somnolence, as well as witnessed apneas.  She is particularly worried about her loud snoring which can be disturbing to others.  She is planning a trip to [place] with her family and October and is worried about keeping others often and not being able to rest well herself.  She has woken up occasionally with a sense of gasping for air.  She does not have night to night nocturia or recurrent nocturnal or morning headaches.  She works full-time, office work.  Bedtime is generally between 10 and 11 PM and rise time between 5:30 AM and 6 AM.  She has 2 brothers with sleep apnea and both have CPAP machines.  She has no history of bruxism, uses Invisalign and is finishing her treatment in about a week.  She drinks caffeine in Brianna form of coffee, about 2 cups in Brianna morning and 1 or 2 soda bottles per day on average.  She quit smoking over 20 years ago and does not currently drink any alcohol.  Her Epworth sleepiness score is 7 out of 24, fatigue severity score is 13 out of 63.  She does not watch TV in her bedroom.  She lives with her husband, they have no pets in Brianna household.  She has had some weight gain in Brianna past year in Brianna realm of 10 to 15 pounds. I reviewed your office note from 09/19/2021.    Her Past Medical History Is Significant For: Past Medical History:  Diagnosis  Date   Allergy    allergic rhinitis-- Dr Willa Rough, allergist   Hypertension    Infertility, female    Thyroid disease    hypothyroidism    Her Past Surgical History Is Significant For: Past Surgical History:  Procedure Laterality Date   TUBAL LIGATION      Her Family History Is Significant For: Family History  Problem Relation Age of Onset   Hypertension Mother    Hyperlipidemia Father    Hypertension Father    Stroke Father    Sleep apnea Brother    Arthritis Other     Her Social History Is Significant For: Social History   Socioeconomic History   Marital status: Married    Spouse name: Not on file   Number of children: Not on file   Years of education: Not on file   Highest education level: Not on file  Occupational History   Not on file  Tobacco Use   Smoking status: Former    Years: 5.00    Types: Cigarettes    Quit date: 04/07/1990    Years since quitting: 31.6   Smokeless tobacco: Never   Tobacco comments:    light smoker  Substance and Sexual Activity   Alcohol use: No    Alcohol/week: 0.0 standard drinks of alcohol   Drug use: No   Sexual activity: Not on file  Other  Topics Concern   Not on file  Social History Narrative   Work or School: Development worker, community Situation: lives with husband      Spiritual Beliefs: Christian      Lifestyle: no regular exercise; diet is not Brianna best      Social Determinants of Corporate investment banker Strain: Not on file  Food Insecurity: Not on file  Transportation Needs: Not on file  Physical Activity: Not on file  Stress: Not on file  Social Connections: Not on file    Her Allergies Are:  Allergies  Allergen Reactions   Ciprofloxacin Other (See Comments)    Muscle pain   Penicillins     REACTION: Rash   Procaine Hcl     REACTION: Rash  :   Her Current Medications Are:  Outpatient Encounter Medications as of 11/13/2021  Medication Sig   albuterol (VENTOLIN HFA) 108 (90 Base) MCG/ACT inhaler  Inhale 2 puffs into Brianna lungs daily as needed.   ciclesonide (ALVESCO) 160 MCG/ACT inhaler Inhale 1 puff into Brianna lungs 2 (two) times daily. Per Dr Willa Rough   desloratadine (CLARINEX) 5 MG tablet TAKE ONE TABLET BY MOUTH DAILY   fluticasone (FLONASE) 50 MCG/ACT nasal spray Place 1 spray into both nostrils daily.   fluticasone (FLOVENT HFA) 110 MCG/ACT inhaler Inhale 2 puffs into Brianna lungs 2 (two) times daily.   hydrocortisone 2.5 % cream APPLY TO AFFECTED AREA TWICE A DAY AS NEEDED   levothyroxine (SYNTHROID, LEVOTHROID) 50 MCG tablet TAKE 1 TABLET ONCE A DAY (IN Brianna MORNING) 30 TO 60 MINUTES BEFORE FOOD OR DRINK   liothyronine (CYTOMEL) 5 MCG tablet TAKE 1 TABLET 30 MINUTES BEFORE FOOD DRINK AND SUPPLEMENTS   predniSONE (DELTASONE) 10 MG tablet Take 5 tabs on day 1, 4 tabs on day 2, 3 tabs on day 3, 2 tabs on day 4, 1 tab on day 5.   SINGULAIR 10 MG tablet TAKE ONE TABLET EACH EVENING TO PREVENT COUGH OR WHEEZE.   valsartan-hydrochlorothiazide (DIOVAN-HCT) 160-12.5 MG tablet TAKE 1 TABLET BY MOUTH DAILY   No facility-administered encounter medications on file as of 11/13/2021.  :   Review of Systems:  Out of a complete 14 point review of systems, all are reviewed and negative with Brianna exception of these symptoms as listed below:  Review of Systems  Neurological:        Pt here for sleep consult  Pt snores , hypertension Pt denies headaches ,fatigue,sleep consult ,CPAP machine     ESS :7 FSS:13     Objective:  Neurological Exam  Physical Exam Physical Examination:   Vitals:   11/13/21 1359  BP: 127/73  Pulse: 81    General Examination: Brianna [name] is a very pleasant 61 y.o. female in no acute distress. She appears well-developed and well-nourished and well groomed.   HEENT: Normocephalic, atraumatic, pupils are equal, round and reactive to light, extraocular tracking is good without limitation to gaze excursion or nystagmus noted. Hearing is grossly intact. Face is symmetric  with normal facial animation. Speech is clear with no dysarthria noted. There is no hypophonia. There is no lip, neck/head, jaw or voice tremor. Neck is supple with full range of passive and active motion. There are no carotid bruits on auscultation. Oropharynx exam reveals: mild mouth dryness, good dental hygiene and mild airway crowding, due to small airway entry and slightly wider uvula.  Tonsils small.  Tongue protrudes centrally and palate elevates symmetrically, Mallampati  class II.  Neck circumference of 16-7/8 inches.  She has a minimal overbite.  Of note, she has Invisalign which she is currently not wearing, she will finish her treatment in about a week.  Chest: Clear to auscultation without wheezing, rhonchi or crackles noted.  Heart: S1+S2+0, regular and normal without murmurs, rubs or gallops noted.   Abdomen: Soft, non-tender and non-distended.  Extremities: There is no pitting edema in Brianna distal lower extremities bilaterally.   Skin: Warm and dry without trophic changes noted.   Musculoskeletal: exam reveals no obvious joint deformities, tenderness or joint swelling or erythema.   Neurologically:  Mental status: Brianna [name] is awake, alert and oriented in all 4 spheres. Her immediate and remote memory, attention, language skills and fund of knowledge are appropriate. There is no evidence of aphasia, agnosia, apraxia or anomia. Speech is clear with normal prosody and enunciation. Thought process is linear. Mood is normal and affect is normal.  Cranial nerves II - XII are as described above under HEENT exam.  Motor exam: Normal bulk, strength and tone is noted. There is no tremor, Romberg is negative. Reflexes are 2+ throughout. Fine motor skills and coordination: grossly intact.  Cerebellar testing: No dysmetria or intention tremor. There is no truncal or gait ataxia.  Sensory exam: intact to light touch in Brianna upper and lower extremities.  Gait, station and balance: She stands  easily. No veering to one side is noted. No leaning to one side is noted. Posture is age-appropriate and stance is narrow based. Gait shows normal stride length and normal pace. No problems turning are noted.   Assessment and Plan:  In summary, Brianna [name] is a very pleasant 61 y.o.-year old female with an underlying medical history of allergies, thyroid disease, hypertension and obesity, whose history and physical exam are concerning for sleep disordered breathing, supporting a current working diagnosis of unspecified sleep apnea, with Brianna main differential diagnoses of obstructive sleep apnea (OSA) versus upper airway resistance syndrome (UARS) versus central sleep apnea (CSA), or mixed sleep apnea. A laboratory attended sleep study is considered gold standard for evaluation of sleep disordered breathing and is recommended at this time and clinically justified.   I had a long chat with Brianna [name] about my findings and Brianna diagnosis of sleep apnea, particularly OSA, its prognosis and treatment options. We talked about medical/conservative treatments, surgical interventions and non-pharmacological approaches for symptom control. I explained, in particular, Brianna risks and ramifications of untreated moderate to severe OSA, especially with respect to developing cardiovascular disease down Brianna road, including congestive heart failure (CHF), difficult to treat hypertension, cardiac arrhythmias (particularly A-fib), neurovascular complications including TIA, stroke and dementia. Even type 2 diabetes has, in part, been linked to untreated OSA. Symptoms of untreated OSA may include (but may not be limited to) daytime sleepiness, nocturia (i.e. frequent nighttime urination), memory problems, mood irritability and suboptimally controlled or worsening mood disorder such as depression and/or anxiety, lack of energy, lack of motivation, physical discomfort, as well as recurrent headaches, especially morning or  nocturnal headaches. We talked about Brianna importance of maintaining a healthy lifestyle and striving for healthy weight. In addition, we talked about Brianna importance of striving for and maintaining good sleep hygiene. I recommended Brianna following at this time: sleep study.  I outlined Brianna differences between a laboratory attended sleep study which is considered more comprehensive and accurate over Brianna option of a home sleep test (HST); Brianna latter may lead to underestimation of sleep disordered  breathing in some instances and does not help with diagnosing upper airway resistance syndrome and is not accurate enough to diagnose primary central sleep apnea typically. I explained Brianna different sleep test procedures to Brianna [name] in detail and also outlined possible surgical and non-surgical treatment options of OSA, including Brianna use of a pressure airway pressure (PAP) device (ie CPAP, AutoPAP/APAP or BiPAP in certain circumstances), a custom-made dental device (aka oral appliance, which would require a referral to a specialist dentist or orthodontist typically, and is generally speaking not considered a good choice for patients with full dentures or edentulous state), upper airway surgical options, such as traditional UPPP (which is not considered a first-line treatment) or Brianna Inspire device (hypoglossal nerve stimulator, which would involve a referral for consultation with an ENT surgeon, after careful selection, following inclusion criteria). I explained Brianna PAP treatment option to Brianna [name] in detail, as this is generally considered first-line treatment.  Brianna [name] indicated that she would be willing to try PAP therapy, if Brianna need arises. I explained Brianna importance of being compliant with PAP treatment, not only for insurance purposes but primarily to improve [name]'s symptoms symptoms, and for Brianna [name]'s long term health benefit, including to reduce Her cardiovascular risks longer-term.    We will pick  up our discussion about Brianna next steps and treatment options after testing.  We will keep her posted as to Brianna test results by phone call and/or MyChart messaging where possible.  We will plan to follow-up in sleep clinic accordingly as well.  I answered all her questions today and Brianna [name] was in agreement.   I encouraged her to call with any interim questions, concerns, problems or updates or email [place] through MyChart.  Generally speaking, sleep test authorizations may take up to 2 weeks, sometimes less, sometimes longer, Brianna [name] is encouraged to get in touch with [place] if they do not hear back from Brianna sleep lab staff directly within Brianna next 2 weeks.  Thank you very much for allowing me to participate in Brianna care of this nice [name]. If I can be of any further assistance to you please do not hesitate to call me at [phone].  Sincerely,   Brianna Foley, MD, PhD

## 2021-11-13 NOTE — Patient Instructions (Signed)

## 2021-11-15 DIAGNOSIS — Z01419 Encounter for gynecological examination (general) (routine) without abnormal findings: Secondary | ICD-10-CM | POA: Diagnosis not present

## 2021-11-15 DIAGNOSIS — Z1231 Encounter for screening mammogram for malignant neoplasm of breast: Secondary | ICD-10-CM | POA: Diagnosis not present

## 2021-11-15 DIAGNOSIS — Z6838 Body mass index (BMI) 38.0-38.9, adult: Secondary | ICD-10-CM | POA: Diagnosis not present

## 2021-11-15 LAB — HM MAMMOGRAPHY

## 2021-11-28 ENCOUNTER — Telehealth: Payer: Self-pay | Admitting: Neurology

## 2021-11-28 NOTE — Telephone Encounter (Signed)
HST- [employer] [id number] (exp. 11/20/21 to 01/18/22).  Patient is scheduled at [facility] for 12/24/21 at 9 AM.  Mailed packet to the patient.

## 2021-11-29 DIAGNOSIS — H1045 Other chronic allergic conjunctivitis: Secondary | ICD-10-CM | POA: Diagnosis not present

## 2021-11-29 DIAGNOSIS — J301 Allergic rhinitis due to pollen: Secondary | ICD-10-CM | POA: Diagnosis not present

## 2021-11-29 DIAGNOSIS — J4530 Mild persistent asthma, uncomplicated: Secondary | ICD-10-CM | POA: Diagnosis not present

## 2021-11-29 DIAGNOSIS — J3089 Other allergic rhinitis: Secondary | ICD-10-CM | POA: Diagnosis not present

## 2021-12-24 ENCOUNTER — Ambulatory Visit: Payer: BC Managed Care – PPO | Admitting: Neurology

## 2021-12-24 DIAGNOSIS — G4733 Obstructive sleep apnea (adult) (pediatric): Secondary | ICD-10-CM

## 2021-12-24 DIAGNOSIS — R635 Abnormal weight gain: Secondary | ICD-10-CM

## 2021-12-24 DIAGNOSIS — R0681 Apnea, not elsewhere classified: Secondary | ICD-10-CM

## 2021-12-24 DIAGNOSIS — E669 Obesity, unspecified: Secondary | ICD-10-CM

## 2021-12-24 DIAGNOSIS — G4719 Other hypersomnia: Secondary | ICD-10-CM

## 2021-12-24 DIAGNOSIS — R0683 Snoring: Secondary | ICD-10-CM

## 2021-12-24 DIAGNOSIS — G4730 Sleep apnea, unspecified: Secondary | ICD-10-CM

## 2021-12-24 DIAGNOSIS — Z820 Family history of epilepsy and other diseases of the nervous system: Secondary | ICD-10-CM

## 2021-12-24 DIAGNOSIS — R0689 Other abnormalities of breathing: Secondary | ICD-10-CM

## 2021-12-25 NOTE — Procedures (Signed)
   [facility]  HOME SLEEP TEST (Watch PAT) REPORT  STUDY DATE: 12/24/2021  DOB: 1960/12/07  MRN: [id number]  ORDERING CLINICIAN: Star Age, MD, PhD   REFERRING CLINICIAN: Billie Ruddy, MD   CLINICAL INFORMATION/HISTORY: 61 year old woman with a history of allergies, thyroid disease, hypertension and obesity, who reports snoring and excessive daytime somnolence, as well as witnessed apneas.   Epworth sleepiness score: 7/24.  BMI: 38.8 kg/m  FINDINGS:   Sleep Summary:   Total Recording Time (hours, min): 7 hours, 56 min  Total Sleep Time (hours, min):  6 hours, 41 min  Percent REM (%):    25.7%   Respiratory Indices:   Calculated pAHI (per hour):  18.4/hour         REM pAHI:    51.2/hour       NREM pAHI: 7.1/hour  Central pAHI: 0/hour  Oxygen Saturation Statistics:    Oxygen Saturation (%) Mean: 95%   Minimum oxygen saturation (%):                 81%   O2 Saturation Range (%): 81-99%    O2 Saturation (minutes) <=88%: 6.9 min  Pulse Rate Statistics:   Pulse Mean (bpm):    96/min    Pulse Range (71-123/min)   IMPRESSION: OSA (obstructive sleep apnea)   RECOMMENDATION:  This home sleep test demonstrates moderate obstructive sleep apnea with a total AHI of 18.4/hour and O2 nadir of 81%.  Moderate to loud snoring was detected fairly consistently throughout the night. Treatment with a positive airway pressure (PAP) device is recommended. The patient will be advised to proceed with an autoPAP titration/trial at home for now. A full night titration study may be considered to optimize treatment settings, if needed down the road.  Alternative treatment options may include a dental device through dentistry or orthodontics in selected patients or Inspire (hypoglossal nerve stimulator) in carefully selected patients (meeting inclusion criteria).  Concomitant weight loss is recommended (where clinically appropriate). Please note that untreated  obstructive sleep apnea may carry additional perioperative morbidity. Patients with significant obstructive sleep apnea should receive perioperative PAP therapy and the surgeons and particularly the anesthesiologist should be informed of the diagnosis and the severity of the sleep disordered breathing. The patient should be cautioned not to drive, work at heights, or operate dangerous or heavy equipment when tired or sleepy. Review and reiteration of good sleep hygiene measures should be pursued with any patient. Other causes of the patient's symptoms, including circadian rhythm disturbances, an underlying mood disorder, medication effect and/or an underlying medical problem cannot be ruled out based on this test. Clinical correlation is recommended. The patient and his referring provider will be notified of the test results. The patient will be seen in follow up in sleep clinic at [facility].  I certify that I have reviewed the raw data recording prior to the issuance of this report in accordance with the standards of the American Academy of Sleep Medicine (AASM).  INTERPRETING PHYSICIAN:   Star Age, MD, PhD  Board Certified in Neurology and Sleep Medicine  [facility] [address], [place], [place] [address] [phone]

## 2021-12-25 NOTE — Progress Notes (Signed)
See procedure note.

## 2021-12-25 NOTE — Addendum Note (Signed)
Addended by: Star Age on: 12/25/2021 06:59 PM   Modules accepted: Orders

## 2021-12-31 ENCOUNTER — Telehealth: Payer: Self-pay

## 2021-12-31 NOTE — Telephone Encounter (Signed)
I called pt. I advised pt that Dr. Rexene Alberts reviewed their sleep study results and found that pt has moderate osa. Dr. Rexene Alberts recommends that pt start an auto-pap at home. I reviewed PAP compliance expectations with the pt. Pt is agreeable to starting an auto-PAP. I advised pt that an order will be sent to a DME, [facility], and [facility] will call the pt within about one week after they file with the pt's insurance. [facility] will show the pt how to use the machine, fit for masks, and troubleshoot the auto-PAP if needed. A follow up appt was made for insurance purposes with Dr. Rexene Alberts on 04/10/22 at 7:45am. Pt verbalized understanding to arrive 15 minutes early and bring their auto-PAP. A letter with all of this information in it will be mailed to the pt as a reminder. I verified with the pt that the address we have on file is correct. Pt verbalized understanding of results. Pt had no questions at this time but was encouraged to call back if questions arise. I have sent the order to [facility] and have received confirmation that they have received the order.

## 2021-12-31 NOTE — Telephone Encounter (Signed)
-----   Message from Star Age, MD sent at 12/25/2021  6:59 PM EDT ----- Patient referred by Dr. Volanda Napoleon, seen by me on 11/13/2021, patient had a HST on 12/24/2021.    Please call and notify the patient that the recent home sleep test showed obstructive sleep apnea in the moderate range. I recommend treatment in the form of autoPAP, which means, that we don't have to bring her in for a sleep study with CPAP, but will let her start using a so called autoPAP machine at home, which is a CPAP-like machine with self-adjusting pressures. We will send the order to a local DME company (of her choice, or as per insurance requirement). The DME representative will fit her with a mask, educate her on how to use the machine, how to put the mask on, etc. I have placed an order in the chart. Please send the order, talk to patient, send report to referring MD. We will need a FU in sleep clinic for 10 weeks post-PAP set up, please arrange that with me or one of our NPs. Also reinforce the need for compliance with treatment. Thanks,   Star Age, MD, PhD [facility])

## 2022-01-03 ENCOUNTER — Encounter: Payer: Self-pay | Admitting: Family Medicine

## 2022-01-16 ENCOUNTER — Other Ambulatory Visit: Payer: Self-pay | Admitting: Family Medicine

## 2022-01-16 DIAGNOSIS — I10 Essential (primary) hypertension: Secondary | ICD-10-CM

## 2022-01-28 ENCOUNTER — Telehealth: Payer: Self-pay | Admitting: Family Medicine

## 2022-01-28 NOTE — Telephone Encounter (Signed)
Sleep study done 12/24/21 wondering when she will hear from someone about obtaining a cpap machine.

## 2022-01-29 NOTE — Telephone Encounter (Signed)
Pt should hear from providers (pulmonologist, neurologist, cardiologist, or sleep medicine provider) who did the sleep study.  Would encourage patient to contact their office.

## 2022-02-10 DIAGNOSIS — G4733 Obstructive sleep apnea (adult) (pediatric): Secondary | ICD-10-CM | POA: Diagnosis not present

## 2022-02-13 ENCOUNTER — Encounter: Payer: Self-pay | Admitting: Neurology

## 2022-02-13 DIAGNOSIS — Z789 Other specified health status: Secondary | ICD-10-CM

## 2022-02-13 DIAGNOSIS — G4733 Obstructive sleep apnea (adult) (pediatric): Secondary | ICD-10-CM

## 2022-02-13 NOTE — Telephone Encounter (Signed)
Will reduce autoPAP to 4-10 cm from 6-12.

## 2022-02-13 NOTE — Addendum Note (Signed)
Addended by: Huston Foley on: 02/13/2022 12:08 PM   Modules accepted: Orders

## 2022-02-13 NOTE — Telephone Encounter (Signed)
Setup date 02/10/2022

## 2022-02-13 NOTE — Telephone Encounter (Signed)
Pressure change made in [facility] (went from 6-12 to 4-10). Also faxed order to [facility]. Received a receipt of confirmation.

## 2022-02-14 DIAGNOSIS — H40023 Open angle with borderline findings, high risk, bilateral: Secondary | ICD-10-CM | POA: Diagnosis not present

## 2022-02-14 DIAGNOSIS — H2513 Age-related nuclear cataract, bilateral: Secondary | ICD-10-CM | POA: Diagnosis not present

## 2022-03-12 DIAGNOSIS — G4733 Obstructive sleep apnea (adult) (pediatric): Secondary | ICD-10-CM | POA: Diagnosis not present

## 2022-04-10 ENCOUNTER — Ambulatory Visit: Payer: BC Managed Care – PPO | Admitting: Neurology

## 2022-04-10 ENCOUNTER — Encounter: Payer: Self-pay | Admitting: Neurology

## 2022-04-10 VITALS — BP 129/69 | HR 86 | Ht 67.0 in | Wt 248.6 lb

## 2022-04-10 DIAGNOSIS — G4733 Obstructive sleep apnea (adult) (pediatric): Secondary | ICD-10-CM

## 2022-04-10 NOTE — Progress Notes (Signed)
Subjective:    Patient ID: Brianna [name] is a 62 y.o. female.  HPI    Interim history:   Brianna [name] is a 62 year old right-handed woman with an underlying medical history of allergies, thyroid disease, hypertension and obesity, who presents for follow-up consultation of her obstructive sleep apnea after interim testing and starting AutoPap therapy.  The patient is unaccompanied today.  I first met her at the request of her primary care physician on 11/13/2021, at which time she reported snoring and daytime somnolence as well as a family history of sleep apnea and witnessed apneas.  She was advised to proceed with a sleep study.  She had a home sleep test on 12/24/2021 which indicated moderate obstructive sleep apnea with an AHI of 18.4/h, O2 nadir 81% with moderate to loud snoring detected.  She was advised to proceed with home AutoPap therapy.  Her set up date was 02/10/2022, she has a ResMed air sense 10 AutoSet machine.  Today, 04/10/2022: I reviewed her AutoPap compliance data from 03/10/2022 through 04/08/2021, which is a total of 30 days, during which time she used her machine every night with percent use days greater than 4 hours at 100%, indicating superb compliance with an average usage of 7 hours and 25 minutes, residual AHI at goal at 0.7/h, 95th percentile of pressure at 10 cm with a range of 4 to 10 cm with EPR of 1, 95th percentile of leak acceptable at 12.2 L/min.  She reports doing quite well, she has adjusted to treatment and has benefited from it.  She uses a fullface mask and turned off the humidifier after she noticed flareup with regards to her allergies with the humidifier.  Years ago she was told by her allergy specialist not to use the humidifier.  She does have some mouth dryness.  She has reduction in snoring as per husband's feedback and improvement in her daytime somnolence.  She even has noticed an improvement in her blood pressure.  She is motivated to continue with treatment.   Her Epworth sleepiness score is 2 out of 24, down from 7 out of 24 before.  He would like a referral to Oneal Grout, dentistry, for consideration of an oral appliance, she would like to have an oral appliance despite doing well with CPAP, she would like it as a backup or for travel purposes at least.  The patient's allergies, current medications, family history, past medical history, past social history, past surgical history and problem list were reviewed and updated as appropriate.   Previously:   11/13/21: (She) reports snoring and excessive daytime somnolence, as well as witnessed apneas.  She is particularly worried about her loud snoring which can be disturbing to others.  She is planning a trip to [place] with her family and October and is worried about keeping others often and not being able to rest well herself.  She has woken up occasionally with a sense of gasping for air.  She does not have night to night nocturia or recurrent nocturnal or morning headaches.  She works full-time, office work.  Bedtime is generally between 10 and 11 PM and rise time between 5:30 AM and 6 AM.  She has 2 brothers with sleep apnea and both have CPAP machines.  She has no history of bruxism, uses Invisalign and is finishing her treatment in about a week.  She drinks caffeine in the form of coffee, about 2 cups in the morning and 1 or 2 soda bottles per day  on average.  She quit smoking over 20 years ago and does not currently drink any alcohol.  Her Epworth sleepiness score is 7 out of 24, fatigue severity score is 13 out of 63.  She does not watch TV in her bedroom.  She lives with her husband, they have no pets in the household.  She has had some weight gain in the past year in the realm of 10 to 15 pounds. I reviewed your office note from 09/19/2021.      Her Past Medical History Is Significant For: Past Medical History:  Diagnosis Date   Allergy    allergic rhinitis-- Dr Ishmael Holter, allergist   Hypertension     Infertility, female    Thyroid disease    hypothyroidism    Her Past Surgical History Is Significant For: Past Surgical History:  Procedure Laterality Date   TUBAL LIGATION      Her Family History Is Significant For: Family History  Problem Relation Age of Onset   Hypertension Mother    Hyperlipidemia Father    Hypertension Father    Stroke Father    Sleep apnea Brother    Arthritis Other     Her Social History Is Significant For: Social History   Socioeconomic History   Marital status: Married    Spouse name: Not on file   Number of children: Not on file   Years of education: Not on file   Highest education level: Not on file  Occupational History   Not on file  Tobacco Use   Smoking status: Former    Years: 5.00    Types: Cigarettes    Quit date: 04/07/1990    Years since quitting: 32.0   Smokeless tobacco: Never   Tobacco comments:    light smoker  Substance and Sexual Activity   Alcohol use: No    Alcohol/week: 0.0 standard drinks of alcohol   Drug use: No   Sexual activity: Not on file  Other Topics Concern   Not on file  Social History Narrative   Work or School: Press photographer       Home Situation: lives with husband      Spiritual Beliefs: Christian      Lifestyle: no regular exercise; diet is not the best      Social Determinants of Radio broadcast assistant Strain: Not on file  Food Insecurity: Not on file  Transportation Needs: Not on file  Physical Activity: Not on file  Stress: Not on file  Social Connections: Not on file    Her Allergies Are:  Allergies  Allergen Reactions   Ciprofloxacin Other (See Comments)    Muscle pain   Penicillins     REACTION: Rash   Procaine Hcl     REACTION: Rash  :   Her Current Medications Are:  Outpatient Encounter Medications as of 04/10/2022  Medication Sig   albuterol (VENTOLIN HFA) 108 (90 Base) MCG/ACT inhaler Inhale 2 puffs into the lungs daily as needed.   ciclesonide (ALVESCO) 160 MCG/ACT  inhaler Inhale 1 puff into the lungs 2 (two) times daily. Per Dr Ishmael Holter   desloratadine (CLARINEX) 5 MG tablet TAKE ONE TABLET BY MOUTH DAILY   fluticasone (FLONASE) 50 MCG/ACT nasal spray Place 1 spray into both nostrils daily.   fluticasone (FLOVENT HFA) 110 MCG/ACT inhaler Inhale 2 puffs into the lungs 2 (two) times daily.   hydrocortisone 2.5 % cream APPLY TO AFFECTED AREA TWICE A DAY AS NEEDED   levothyroxine (SYNTHROID, LEVOTHROID)  50 MCG tablet TAKE 1 TABLET ONCE A DAY (IN THE MORNING) 30 TO 60 MINUTES BEFORE FOOD OR DRINK   liothyronine (CYTOMEL) 5 MCG tablet TAKE 1 TABLET 30 MINUTES BEFORE FOOD DRINK AND SUPPLEMENTS   SINGULAIR 10 MG tablet TAKE ONE TABLET EACH EVENING TO PREVENT COUGH OR WHEEZE.   valsartan-hydrochlorothiazide (DIOVAN-HCT) 160-12.5 MG tablet TAKE 1 TABLET BY MOUTH DAILY   [DISCONTINUED] predniSONE (DELTASONE) 10 MG tablet Take 5 tabs on day 1, 4 tabs on day 2, 3 tabs on day 3, 2 tabs on day 4, 1 tab on day 5. (Patient not taking: Reported on 04/10/2022)   No facility-administered encounter medications on file as of 04/10/2022.  :  Review of Systems:  Out of a complete 14 point review of systems, all are reviewed and negative with the exception of these symptoms as listed below:  Review of Systems  Neurological:        Doing well, although does note has at times a lot of air which is painful).  ESS 2.      Objective:  Neurological Exam  Physical Exam Physical Examination:   Vitals:   04/10/22 0752  BP: 129/69  Pulse: 86    General Examination: The patient is a very pleasant 62 y.o. female in no acute distress. She appears well-developed and well-nourished and well groomed.   HEENT: Normocephalic, atraumatic, pupils are equal, round and reactive to light, extraocular tracking is good without limitation to gaze excursion or nystagmus noted. Hearing is grossly intact. Face is symmetric with normal facial animation. Speech is clear with no dysarthria noted. There  is no hypophonia. There is no lip, neck/head, jaw or voice tremor. Neck is supple with full range of passive and active motion. There are no carotid bruits on auscultation. Oropharynx exam reveals: mild mouth dryness, good dental hygiene and mild airway crowding, due to small airway entry and slightly wider uvula.  Tonsils small.  Tongue protrudes centrally and palate elevates symmetrically, Mallampati class II.  Neck circumference of 16-7/8 inches.  She has a minimal overbite.  Of note, she has Invisalign which she is currently not wearing, she will finish her treatment in about a week.   Chest: Clear to auscultation without wheezing, rhonchi or crackles noted.   Heart: S1+S2+0, regular and normal without murmurs, rubs or gallops noted.    Abdomen: Soft, non-tender and non-distended.   Extremities: There is no pitting edema in the distal lower extremities bilaterally.    Skin: Warm and dry without trophic changes noted.    Musculoskeletal: exam reveals no obvious joint deformities, tenderness or joint swelling or erythema.    Neurologically:  Mental status: The patient is awake, alert and oriented in all 4 spheres. Her immediate and remote memory, attention, language skills and fund of knowledge are appropriate. There is no evidence of aphasia, agnosia, apraxia or anomia. Speech is clear with normal prosody and enunciation. Thought process is linear. Mood is normal and affect is normal.  Cranial nerves II - XII are as described above under HEENT exam.  Motor exam: Normal bulk, strength and tone is noted. There is no tremor, Romberg is negative. Reflexes are 2+ throughout. Fine motor skills and coordination: grossly intact.  Cerebellar testing: No dysmetria or intention tremor. There is no truncal or gait ataxia.  Sensory exam: intact to light touch in the upper and lower extremities.  Gait, station and balance: She stands easily. No veering to one side is noted. No leaning to one side is  noted.  Posture is age-appropriate and stance is narrow based. Gait shows normal stride length and normal pace. No problems turning are noted.    Assessment and Plan:  In summary, Brianna [name] is a very pleasant 62 year old female with an underlying medical history of allergies, thyroid disease, hypertension and obesity, who presents for follow-up consultation of her obstructive sleep apnea after interim testing and starting AutoPap therapy.  Her home sleep test on 12/24/2021 indicated moderate obstructive sleep apnea with an AHI of 18.4/h, O2 nadir 81% with moderate to loud snoring detected.  She established treatment with AutoPap therapy at home on 02/10/2022, she has a ResMed air sense 10 AutoSet machine. Her DME company is [employer].  She is fully compliant with treatment and has benefited from PAP therapy.  Her apnea control is very good.  She is highly commended for treatment adherence.  She is motivated to continue with treatment but would like a referral to dentistry for consideration of an oral appliance as a backup or secondary treatment and also for travel purposes.  She would like to see Dr. Oneal Grout for this and I placed a referral.  At this juncture, she is advised to follow-up in sleep clinic to see one of our nurse practitioners routinely in 1 year.  We reviewed her sleep test results and her compliance data in detail today.  I answered all her questions today and she was in agreement with our plan.   I spent 30 minutes in total face-to-face time and in reviewing records during pre-charting, more than 50% of which was spent in counseling and coordination of care, reviewing test results, reviewing medications and treatment regimen and/or in discussing or reviewing the diagnosis of OSA, the prognosis and treatment options. Pertinent laboratory and imaging test results that were available during this visit with the patient were reviewed by me and considered in my medical decision making (see chart for  details).

## 2022-04-10 NOTE — Patient Instructions (Addendum)
It was nice to see you again today. I am glad to hear, things are going quite well with your autoPAP therapy. You have adjusted well to treatment with your new machine, and you are compliant with it. You have also fulfilled the insurance-mandated compliance percentage, which is reassuring, so you can get ongoing supplies through your insurance. Please talk to your DME provider about getting replacement supplies on a regular basis. Please be sure to change your filter every month, your mask about every 3 months, hose about every 6 months, humidifier chamber about yearly. Some restrictions are imposed by your insurance carrier with regard to how frequently you can get certain supplies.  Your DME company can provide further details if necessary. I would be happy to place a referral to Dr. Oneal Grout so you can consider an oral appliance as a backup for treatment of your sleep apnea or for travel purposes. Please continue using your autoPAP regularly. While your insurance requires that you use PAP at least 4 hours each night on 70% of the nights, I recommend, that you not skip any nights and use it throughout the night if you can. Getting used to PAP and staying with the treatment long term does take time and patience and discipline. Untreated obstructive sleep apnea when it is moderate to severe can have an adverse impact on cardiovascular health and raise her risk for heart disease, arrhythmias, hypertension, congestive heart failure, stroke and diabetes. Untreated obstructive sleep apnea causes sleep disruption, nonrestorative sleep, and sleep deprivation. This can have an impact on your day to day functioning and cause daytime sleepiness and impairment of cognitive function, memory loss, mood disturbance, and problems focussing. Using PAP regularly can improve these symptoms.  We can see you in 1 year, you can see one of our nurse practitioners as you are stable.

## 2022-04-12 DIAGNOSIS — G4733 Obstructive sleep apnea (adult) (pediatric): Secondary | ICD-10-CM | POA: Diagnosis not present

## 2022-05-07 DIAGNOSIS — G4733 Obstructive sleep apnea (adult) (pediatric): Secondary | ICD-10-CM | POA: Diagnosis not present

## 2022-05-13 DIAGNOSIS — G4733 Obstructive sleep apnea (adult) (pediatric): Secondary | ICD-10-CM | POA: Diagnosis not present

## 2022-07-15 ENCOUNTER — Other Ambulatory Visit: Payer: Self-pay | Admitting: Family Medicine

## 2022-07-15 DIAGNOSIS — I10 Essential (primary) hypertension: Secondary | ICD-10-CM

## 2022-08-11 ENCOUNTER — Encounter: Payer: Self-pay | Admitting: Family Medicine

## 2022-08-11 ENCOUNTER — Ambulatory Visit: Payer: BC Managed Care – PPO | Admitting: Family Medicine

## 2022-08-11 VITALS — BP 128/82 | HR 92 | Temp 98.7°F | Ht 67.0 in | Wt 250.0 lb

## 2022-08-11 DIAGNOSIS — G4733 Obstructive sleep apnea (adult) (pediatric): Secondary | ICD-10-CM | POA: Diagnosis not present

## 2022-08-11 DIAGNOSIS — E039 Hypothyroidism, unspecified: Secondary | ICD-10-CM

## 2022-08-11 DIAGNOSIS — I10 Essential (primary) hypertension: Secondary | ICD-10-CM | POA: Diagnosis not present

## 2022-08-11 DIAGNOSIS — J302 Other seasonal allergic rhinitis: Secondary | ICD-10-CM | POA: Diagnosis not present

## 2022-08-11 DIAGNOSIS — Z6839 Body mass index (BMI) 39.0-39.9, adult: Secondary | ICD-10-CM

## 2022-08-11 DIAGNOSIS — Z0000 Encounter for general adult medical examination without abnormal findings: Secondary | ICD-10-CM

## 2022-08-11 DIAGNOSIS — Z23 Encounter for immunization: Secondary | ICD-10-CM | POA: Diagnosis not present

## 2022-08-11 DIAGNOSIS — E7841 Elevated Lipoprotein(a): Secondary | ICD-10-CM

## 2022-08-11 LAB — CBC WITH DIFFERENTIAL/PLATELET
Basophils Absolute: 0 10*3/uL (ref 0.0–0.1)
Basophils Relative: 0.7 % (ref 0.0–3.0)
Eosinophils Absolute: 0.2 10*3/uL (ref 0.0–0.7)
Eosinophils Relative: 3.4 % (ref 0.0–5.0)
HCT: 41.2 % (ref 36.0–46.0)
Hemoglobin: 14 g/dL (ref 12.0–15.0)
Lymphocytes Relative: 34 % (ref 12.0–46.0)
Lymphs Abs: 2.2 10*3/uL (ref 0.7–4.0)
MCHC: 34.1 g/dL (ref 30.0–36.0)
MCV: 90.2 fl (ref 78.0–100.0)
Monocytes Absolute: 0.8 10*3/uL (ref 0.1–1.0)
Monocytes Relative: 11.8 % (ref 3.0–12.0)
Neutro Abs: 3.2 10*3/uL (ref 1.4–7.7)
Neutrophils Relative %: 50.1 % (ref 43.0–77.0)
Platelets: 318 10*3/uL (ref 150.0–400.0)
RBC: 4.57 Mil/uL (ref 3.87–5.11)
RDW: 13.3 % (ref 11.5–15.5)
WBC: 6.5 10*3/uL (ref 4.0–10.5)

## 2022-08-11 LAB — COMPREHENSIVE METABOLIC PANEL
ALT: 41 U/L — ABNORMAL HIGH (ref 0–35)
AST: 41 U/L — ABNORMAL HIGH (ref 0–37)
Albumin: 3.9 g/dL (ref 3.5–5.2)
Alkaline Phosphatase: 96 U/L (ref 39–117)
BUN: 8 mg/dL (ref 6–23)
CO2: 28 mEq/L (ref 19–32)
Calcium: 9.5 mg/dL (ref 8.4–10.5)
Chloride: 102 mEq/L (ref 96–112)
Creatinine, Ser: 0.76 mg/dL (ref 0.40–1.20)
GFR: 85.65 mL/min (ref >60.00)
Glucose, Bld: 116 mg/dL — ABNORMAL HIGH (ref 70–99)
Potassium: 4 mEq/L (ref 3.5–5.1)
Sodium: 141 mEq/L (ref 135–145)
Total Bilirubin: 0.5 mg/dL (ref 0.2–1.2)
Total Protein: 7.4 g/dL (ref 6.0–8.3)

## 2022-08-11 LAB — TSH: TSH: 6.31 u[IU]/mL — ABNORMAL HIGH (ref 0.35–5.50)

## 2022-08-11 LAB — LIPID PANEL
Cholesterol: 170 mg/dL (ref 0–200)
HDL: 39.6 mg/dL (ref >39.00)
LDL Cholesterol: 108 mg/dL — ABNORMAL HIGH (ref 0–99)
NonHDL: 130.68
Total CHOL/HDL Ratio: 4
Triglycerides: 112 mg/dL (ref 0.0–149.0)
VLDL: 22.4 mg/dL (ref 0.0–40.0)

## 2022-08-11 LAB — HEMOGLOBIN A1C: Hgb A1c MFr Bld: 6.7 % — ABNORMAL HIGH (ref 4.6–6.5)

## 2022-08-11 MED ORDER — LEVOTHYROXINE SODIUM 50 MCG PO TABS: ORAL_TABLET | ORAL | 3 refills | Status: DC

## 2022-08-11 MED ORDER — LIOTHYRONINE SODIUM 5 MCG PO TABS: ORAL_TABLET | ORAL | 3 refills | Status: DC

## 2022-08-11 MED ORDER — VALSARTAN-HYDROCHLOROTHIAZIDE 160-12.5 MG PO TABS
1.0000 | ORAL_TABLET | Freq: Every day | ORAL | 3 refills | Status: DC
Start: 2022-08-11 — End: 2023-11-06

## 2022-08-11 NOTE — Progress Notes (Addendum)
Established Patient Office Visit   Subjective  Patient ID: Brianna [name], female    DOB: 1960/05/15  Age: 62 y.o. MRN: [id number]  Chief Complaint  Patient presents with   Annual Exam    Patient is a 62 year old female with pmh sig for HTN, OSA on CPAP, HLD, hypothyroidism, seasonal allergies, asthma who presents for CPE.  Patient states she is doing well overall.  Having difficulty with CPAP 2/2 allergies.  Unable to use humidifier as previously told not to by allergist.  Patient states she wakes up every morning with throat extremely dry causing coughing and irritation.  Taking Flonase, Singulair, Aller tech for allergies.  Requesting refills on blood pressure medicine and thyroid medicine.  Sees endocrinology but has been unable to get refills.  Not checking BP at home.  States can tell when she missed several doses after leaving her medicine while on vacation.  Had a headache at that time.  Colonoscopy due next year.  Mammogram due in August.  Last Pap 11/13/2020.      ROS Negative unless stated above    Objective:     BP 128/82 (BP Location: Right Arm, Cuff Size: Large)   Pulse 92   Temp 98.7 F (37.1 C) (Oral)   Ht 5\' 7"  (1.702 m)   Wt 250 lb (113.4 kg)   SpO2 96%   BMI 39.16 kg/m    Physical Exam Constitutional:      Appearance: Normal appearance.  HENT:     Head: Normocephalic and atraumatic.     Right Ear: Tympanic membrane, ear canal and external ear normal.     Left Ear: Tympanic membrane, ear canal and external ear normal.     Nose: Nose normal.     Mouth/Throat:     Mouth: Mucous membranes are moist.     Pharynx: No oropharyngeal exudate or posterior oropharyngeal erythema.  Eyes:     General: No scleral icterus.    Extraocular Movements: Extraocular movements intact.     Conjunctiva/sclera: Conjunctivae normal.     Pupils: Pupils are equal, round, and reactive to light.  Neck:     Thyroid: No thyromegaly.  Cardiovascular:     Rate and Rhythm:  Normal rate and regular rhythm.     Pulses: Normal pulses.     Heart sounds: Normal heart sounds. No murmur heard.    No friction rub.  Pulmonary:     Effort: Pulmonary effort is normal.     Breath sounds: Normal breath sounds. No wheezing, rhonchi or rales.  Abdominal:     General: Bowel sounds are normal.     Palpations: Abdomen is soft.     Tenderness: There is no abdominal tenderness.  Musculoskeletal:        General: No deformity. Normal range of motion.  Lymphadenopathy:     Cervical: No cervical adenopathy.  Skin:    General: Skin is warm and dry.     Findings: No lesion.  Neurological:     General: No focal deficit present.     Mental Status: She is alert and oriented to person, place, and time.  Psychiatric:        Mood and Affect: Mood normal.        Thought Content: Thought content normal.      No results found for any visits on 08/11/22.    Assessment & Plan:  Well adult exam -Age-appropriate health screening -Immunizations reviewed.  Tdap, shingles due.  Tdap given this visit. -Colonoscopy due  2025 -Mammogram due August 2024 -Pap done 11/13/2020.  Repeat in 2027. -     Lipid panel  Essential hypertension -Elevated 148/82 -Recheck 128/82 -Continue current medications including valsartan-hydrochlorothiazide -Lifestyle modifications -Patient encouraged to check BP at home. -     CBC with Differential/Platelet -     Comprehensive metabolic panel -     Valsartan-hydroCHLOROthiazide; Take 1 tablet by mouth daily.  Dispense: 90 tablet; Refill: 3  Hypothyroidism, unspecified type -Continue follow-up with endocrinology -     TSH -     Levothyroxine Sodium; TAKE 1 TABLET ONCE A DAY (IN THE MORNING) 30 TO 60 MINUTES BEFORE FOOD OR DRINK  Dispense: 90 tablet; Refill: 3 -     Liothyronine Sodium; TAKE 1 TABLET 30 MINUTES BEFORE FOOD DRINK AND SUPPLEMENTS  Dispense: 90 tablet; Refill: 3  OSA on CPAP -     CBC with Differential/Platelet  Seasonal allergies -      CBC with Differential/Platelet  Class 2 severe obesity with serious comorbidity and body mass index (BMI) of 39.0 to 39.9 in adult, unspecified obesity type (HCC) -     TSH -     Hemoglobin A1c -     Lipid panel  Elevated lipoprotein(a) -Lipid panel  Need for Tdap -Tdap given this visit  Return if symptoms worsen or fail to improve.  Follow-up in 6 months, sooner if needed.  Deeann Saint, MD

## 2022-08-11 NOTE — Addendum Note (Signed)
Addended by: Elwin Mocha on: 08/11/2022 09:09 AM   Modules accepted: Orders

## 2022-08-11 NOTE — Patient Instructions (Addendum)
Consider getting a Tdap vaccine to protect against diphtheria, acellular pertussis, tetanus.

## 2022-08-14 ENCOUNTER — Encounter: Payer: Self-pay | Admitting: Family Medicine

## 2022-08-14 DIAGNOSIS — E119 Type 2 diabetes mellitus without complications: Secondary | ICD-10-CM | POA: Insufficient documentation

## 2022-08-18 DIAGNOSIS — H2513 Age-related nuclear cataract, bilateral: Secondary | ICD-10-CM | POA: Diagnosis not present

## 2022-08-18 DIAGNOSIS — H40023 Open angle with borderline findings, high risk, bilateral: Secondary | ICD-10-CM | POA: Diagnosis not present

## 2022-08-29 ENCOUNTER — Encounter: Payer: Self-pay | Admitting: Family Medicine

## 2022-08-29 ENCOUNTER — Ambulatory Visit: Payer: BC Managed Care – PPO | Admitting: Family Medicine

## 2022-08-29 VITALS — BP 124/64 | HR 105 | Temp 98.4°F | Wt 244.2 lb

## 2022-08-29 DIAGNOSIS — S70261A Insect bite (nonvenomous), right hip, initial encounter: Secondary | ICD-10-CM

## 2022-08-29 DIAGNOSIS — W57XXXA Bitten or stung by nonvenomous insect and other nonvenomous arthropods, initial encounter: Secondary | ICD-10-CM

## 2022-08-29 DIAGNOSIS — R7303 Prediabetes: Secondary | ICD-10-CM | POA: Diagnosis not present

## 2022-08-29 DIAGNOSIS — E039 Hypothyroidism, unspecified: Secondary | ICD-10-CM | POA: Diagnosis not present

## 2022-08-29 DIAGNOSIS — Z6838 Body mass index (BMI) 38.0-38.9, adult: Secondary | ICD-10-CM

## 2022-08-29 LAB — POCT GLYCOSYLATED HEMOGLOBIN (HGB A1C): Hemoglobin A1C: 6.3 % — AB (ref 4.0–5.6)

## 2022-08-29 MED ORDER — DOXYCYCLINE HYCLATE 100 MG PO TABS
100.0000 mg | ORAL_TABLET | Freq: Two times a day (BID) | ORAL | 0 refills | Status: AC
Start: 2022-08-29 — End: 2022-09-12

## 2022-08-29 NOTE — Progress Notes (Signed)
Established Patient Office Visit   Subjective  Patient ID: Brianna [name], female    DOB: [birth year]  Age: 62 y.o. MRN: [id number]  Chief Complaint  Patient presents with   Tick Removal    Last Friday removed the tick from right hip    Patient is a 62 year old female seen for acute concern.  Patient endorses finding a tick on her right hip 7 days ago.  Tick removed completely with tweezers.  Area now slightly raised and red without tenderness.  Patient inquires about lab results from last office visit.  States cholesterol was elevated, thyroid elevated, A1c.  Patient states she does not want to claim diabetes.  Inquires about what can be done to improve her numbers.  Patient also inquires about weight loss medication.  She is adamant that she does not want to take Ozempic.    Patient Active Problem List   Diagnosis Date Noted   Controlled type 2 diabetes mellitus without complication, without long-term current use of insulin (HCC) 08/14/2022   Hyperlipidemia 11/25/2019   Hypertension 08/03/2012   Hypothyroidism 06/21/2009   Obesity 06/21/2009   Allergic rhinitis 06/21/2009   Past Surgical History:  Procedure Laterality Date   TUBAL LIGATION     Social History   Tobacco Use   Smoking status: Former    Years: 5    Types: Cigarettes    Quit date: 04/07/1990    Years since quitting: 32.4   Smokeless tobacco: Never   Tobacco comments:    light smoker  Substance Use Topics   Alcohol use: No    Alcohol/week: 0.0 standard drinks of alcohol   Drug use: No   Family History  Problem Relation Age of Onset   Hypertension Mother    Hyperlipidemia Father    Hypertension Father    Stroke Father    Sleep apnea Brother    Arthritis Other    Allergies  Allergen Reactions   Ciprofloxacin Other (See Comments)    Muscle pain   Penicillins     REACTION: Rash   Procaine Hcl     REACTION: Rash      ROS Negative unless stated above    Objective:     BP 124/64 (BP  Location: Right Arm, Patient Position: Sitting, Cuff Size: Large)   Pulse (!) 105   Temp 98.4 F (36.9 C) (Oral)   Wt 244 lb 3.2 oz (110.8 kg)   SpO2 98%   BMI 38.25 kg/m    Physical Exam Constitutional:      Appearance: Normal appearance.  HENT:     Head: Normocephalic and atraumatic.     Mouth/Throat:     Mouth: Mucous membranes are moist.  Eyes:     Extraocular Movements: Extraocular movements intact.     Conjunctiva/sclera: Conjunctivae normal.  Cardiovascular:     Rate and Rhythm: Normal rate.     Pulses: Normal pulses.     Heart sounds: Normal heart sounds.  Pulmonary:     Effort: Pulmonary effort is normal.     Breath sounds: Normal breath sounds.  Skin:    General: Skin is warm and dry.          Comments: Right hip with central area of ecchymosis, 1 cm surrounded by ring of erythema.  No TTP.  No drainage.  No retained debris.  Neurological:     Mental Status: She is alert.      No results found for any visits on 08/29/22.    Assessment &  Plan:  Tick bite of right hip, initial encounter -Start doxycycline for possible tickborne illness -     Doxycycline Hyclate; Take 1 tablet (100 mg total) by mouth 2 (two) times daily for 14 days.  Dispense: 28 tablet; Refill: 0  Class 2 severe obesity with serious comorbidity and body mass index (BMI) of 38.0 to 38.9 in adult, unspecified obesity type (HCC) -Body mass index is 38.25 kg/m. -Patient encouraged to check with insurance company regarding medications covered for weight loss. -Lifestyle modifications encouraged -Likely contributing to elevation in LFTs on 08/11/2022 AST and ALT 41.  Prediabetes -hgb A1C was 6.7% on 08/11/22. -hgb A1C this visit 6.3% -Lifestyle modification strongly encouraged -     POCT glycosylated hemoglobin (Hb A1C)  Hypothyroidism, unspecified type -TSH 6.31 on 08/11/2022 -Recheck TSH in 3-4 weeks the patient has been consistently taking Synthroid 50 mcg       -     TSH, Future  Return  in about 3 months (around 11/29/2022), sooner if needed.   Deeann Saint, MD

## 2022-08-29 NOTE — Patient Instructions (Addendum)
As previously discussed we will recheck TSH in about 3 weeks if you have been back on your medication consistently.  Hemoglobin A1c is 6.3% this visit.  This is in the prediabetic range.

## 2022-11-21 ENCOUNTER — Other Ambulatory Visit: Payer: Self-pay | Admitting: Family Medicine

## 2022-11-21 DIAGNOSIS — I10 Essential (primary) hypertension: Secondary | ICD-10-CM

## 2022-12-01 DIAGNOSIS — J3089 Other allergic rhinitis: Secondary | ICD-10-CM | POA: Diagnosis not present

## 2022-12-01 DIAGNOSIS — J301 Allergic rhinitis due to pollen: Secondary | ICD-10-CM | POA: Diagnosis not present

## 2022-12-01 DIAGNOSIS — J4530 Mild persistent asthma, uncomplicated: Secondary | ICD-10-CM | POA: Diagnosis not present

## 2022-12-01 DIAGNOSIS — H1045 Other chronic allergic conjunctivitis: Secondary | ICD-10-CM | POA: Diagnosis not present

## 2022-12-02 DIAGNOSIS — Z6837 Body mass index (BMI) 37.0-37.9, adult: Secondary | ICD-10-CM | POA: Diagnosis not present

## 2022-12-02 DIAGNOSIS — Z124 Encounter for screening for malignant neoplasm of cervix: Secondary | ICD-10-CM | POA: Diagnosis not present

## 2022-12-02 DIAGNOSIS — Z01419 Encounter for gynecological examination (general) (routine) without abnormal findings: Secondary | ICD-10-CM | POA: Diagnosis not present

## 2022-12-31 DIAGNOSIS — G4733 Obstructive sleep apnea (adult) (pediatric): Secondary | ICD-10-CM | POA: Diagnosis not present

## 2023-01-29 ENCOUNTER — Ambulatory Visit: Payer: BC Managed Care – PPO | Admitting: Family Medicine

## 2023-01-29 ENCOUNTER — Encounter: Payer: Self-pay | Admitting: Family Medicine

## 2023-01-29 VITALS — BP 142/84 | HR 89 | Temp 98.1°F | Ht 67.0 in | Wt 243.8 lb

## 2023-01-29 DIAGNOSIS — J069 Acute upper respiratory infection, unspecified: Secondary | ICD-10-CM

## 2023-01-29 DIAGNOSIS — R0982 Postnasal drip: Secondary | ICD-10-CM | POA: Diagnosis not present

## 2023-01-29 DIAGNOSIS — R0989 Other specified symptoms and signs involving the circulatory and respiratory systems: Secondary | ICD-10-CM

## 2023-01-29 LAB — POC COVID19 BINAXNOW: SARS Coronavirus 2 Ag: NEGATIVE

## 2023-01-29 LAB — POCT RAPID STREP A (OFFICE): Rapid Strep A Screen: NEGATIVE

## 2023-01-29 LAB — POCT INFLUENZA A/B
Influenza A, POC: NEGATIVE
Influenza B, POC: NEGATIVE

## 2023-01-29 MED ORDER — BENZONATATE 100 MG PO CAPS
100.0000 mg | ORAL_CAPSULE | Freq: Two times a day (BID) | ORAL | 0 refills | Status: DC | PRN
Start: 2023-01-29 — End: 2023-11-20

## 2023-01-29 NOTE — Progress Notes (Signed)
Established Patient Office Visit   Subjective  Patient ID: Marceil Lintz, female    DOB: 1961/03/31  Age: 62 y.o. MRN: [id number]  Chief Complaint  Patient presents with   Cough    Cough congestion, fever, chest pressure, started 4 days ago     Patient is a 62 year old female seen for acute concern.  Patient endorses cough, congestion, elevated temperature Tmax 100.1 F, slight headache, toothache x 4 days.  Patient states she has ear pressure/discomfort at baseline.  Has also noticed a heaviness in chest with exertion.  Patient denies SOB, wheezing, nausea, vomiting, diarrhea, chills, body aches.  Patient states she typically does not feel drainage in the back of her throat.  Tried Tylenol, Tylenol Cold and severe flu, Alka-Seltzer, Motrin, Aleve.   Patient Active Problem List   Diagnosis Date Noted   Controlled type 2 diabetes mellitus without complication, without long-term current use of insulin (HCC) 08/14/2022   Hyperlipidemia 11/25/2019   Hypertension 08/03/2012   Hypothyroidism 06/21/2009   Obesity 06/21/2009   Allergic rhinitis 06/21/2009   Past Medical History:  Diagnosis Date   Allergy    allergic rhinitis-- Dr Willa Rough, allergist   Hypertension    Infertility, female    Thyroid disease    hypothyroidism   Past Surgical History:  Procedure Laterality Date   TUBAL LIGATION     Social History   Tobacco Use   Smoking status: Former    Current packs/day: 0.00    Types: Cigarettes    Start date: 04/07/1985    Quit date: 04/07/1990    Years since quitting: 32.8   Smokeless tobacco: Never   Tobacco comments:    light smoker  Substance Use Topics   Alcohol use: No    Alcohol/week: 0.0 standard drinks of alcohol   Drug use: No   Family History  Problem Relation Age of Onset   Hypertension Mother    Hyperlipidemia Father    Hypertension Father    Stroke Father    Sleep apnea Brother    Arthritis Other    Allergies  Allergen Reactions   Ciprofloxacin  Other (See Comments)    Muscle pain   Penicillins     REACTION: Rash   Procaine Hcl     REACTION: Rash      ROS Negative unless stated above    Objective:     BP (!) 142/84 (BP Location: Left Arm, Patient Position: Sitting, Cuff Size: Large)   Pulse 89   Temp 98.1 F (36.7 C) (Oral)   Ht 5\' 7"  (1.702 m)   Wt 243 lb 12.8 oz (110.6 kg)   LMP  (LMP Unknown)   SpO2 97%   BMI 38.18 kg/m  BP Readings from Last 3 Encounters:  01/29/23 (!) 142/84  08/29/22 124/64  08/11/22 128/82   Wt Readings from Last 3 Encounters:  01/29/23 243 lb 12.8 oz (110.6 kg)  08/29/22 244 lb 3.2 oz (110.8 kg)  08/11/22 250 lb (113.4 kg)      Physical Exam Constitutional:      General: She is not in acute distress.    Appearance: Normal appearance.  HENT:     Head: Normocephalic and atraumatic.     Ears:     Comments: Bilateral TMs full.    Nose: Nose normal.     Right Turbinates: Enlarged.     Left Turbinates: Enlarged.     Right Sinus: No maxillary sinus tenderness or frontal sinus tenderness.     Left  Sinus: No maxillary sinus tenderness or frontal sinus tenderness.     Mouth/Throat:     Mouth: Mucous membranes are moist.     Pharynx: Postnasal drip present.  Cardiovascular:     Rate and Rhythm: Normal rate and regular rhythm.     Heart sounds: Normal heart sounds. No murmur heard.    No gallop.  Pulmonary:     Effort: Pulmonary effort is normal. No respiratory distress.     Breath sounds: Normal breath sounds. No wheezing, rhonchi or rales.  Skin:    General: Skin is warm and dry.  Neurological:     Mental Status: She is alert and oriented to person, place, and time.      Results for orders placed or performed in visit on 01/29/23  POC Influenza A/B  Result Value Ref Range   Influenza A, POC Negative Negative   Influenza B, POC Negative Negative  POC COVID-19 BinaxNow  Result Value Ref Range   SARS Coronavirus 2 Ag Negative Negative  POCT rapid strep A  Result Value  Ref Range   Rapid Strep A Screen Negative Negative      Assessment & Plan:  Viral URI with cough -     Benzonatate; Take 1 capsule (100 mg total) by mouth 2 (two) times daily as needed for cough.  Dispense: 30 capsule; Refill: 0  Chest congestion -     POCT Influenza A/B -     POC COVID-19 BinaxNow -     POCT rapid strep A  Post-nasal drainage  Patient with acute viral URI symptoms.  POC COVID, flu, strep testing negative in clinic.  Discussed antihistamine to help with postnasal drainage.  Continue other supportive care.  Given strict precautions for continued or worsening symptoms.  Tessalon sent for cough.  Patient with allergy to procaine but has tolerated Tessalon in 2017.  Patient called to verify.  Return if symptoms worsen or fail to improve.   Deeann Saint, MD

## 2023-02-02 ENCOUNTER — Telehealth: Payer: Self-pay | Admitting: Neurology

## 2023-02-02 DIAGNOSIS — G4733 Obstructive sleep apnea (adult) (pediatric): Secondary | ICD-10-CM

## 2023-02-02 NOTE — Telephone Encounter (Signed)
Pt said CPAP  machine started cutting off in the middle of the night on Thursday. When this happens it wakes me up. Contact DME and was advised to  call your office.

## 2023-02-02 NOTE — Telephone Encounter (Signed)
Spoke with patient and advised to have a mask check/refit when she goes to [facility]. Order sent to [facility]. Patient appreciative.

## 2023-02-02 NOTE — Telephone Encounter (Signed)
[facility] confirmed receipt of order.  

## 2023-02-02 NOTE — Telephone Encounter (Signed)
I spoke with [facility]. Patient had been misinformed. She needs to call them and schedule an appointment for her machine to be looked at. I called the pt. She doesn't recall who told her that she needed to call us. She is going to call [facility] to schedule the appointment. I gave her their phone number. She said the machine has cut off for a few nights now and one night it cut off twice. I told her I would also pull a report that Dr Frances Furbish can check and see if there are any obvious issues we can help with. Pt was appreciative.

## 2023-02-02 NOTE — Telephone Encounter (Signed)
When she goes to her DME provider's office, please remind patient to get a mask check as the leak from the mask is higher and he may need a refit or a different mask size or style.  Otherwise settings look okay.

## 2023-02-02 NOTE — Addendum Note (Signed)
Addended by: Bertram Savin on: 02/02/2023 01:28 PM   Modules accepted: Orders

## 2023-04-16 ENCOUNTER — Encounter: Payer: Self-pay | Admitting: Anesthesiology

## 2023-04-16 NOTE — Progress Notes (Signed)
 PATIENT: Brianna [name] DOB: 01/09/1961  REASON FOR VISIT: follow up HISTORY FROM: patient PRIMARY NEUROLOGIST: Dr. Buck   Virtual Visit via Video Note  I connected with Brianna [name] on 04/19/23 at  8:30 AM EST by a video enabled telemedicine application located remotely at [facility] and verified that I am speaking with the correct person using two identifiers who was located at their own home.   I discussed the limitations of evaluation and management by telemedicine and the availability of in person appointments. The patient expressed understanding and agreed to proceed.    HISTORY OF PRESENT ILLNESS: Today 04/19/23:  Brianna [name] is a 63 y.o. female with a history of OSA on CPAP. Returns today for follow-up.  Reports that the CPAP is working well for her.  She denies any new issues.  At the last visit she was referred for dental device to use when she travels however states she has not done this yet.  Her download is below.    REVIEW OF SYSTEMS: Out of a complete 14 system review of symptoms, the patient complains only of the following symptoms, and all other reviewed systems are negative.  ALLERGIES: Allergies  Allergen Reactions   Ciprofloxacin  Other (See Comments)    Muscle pain   Penicillins     REACTION: Rash   Procaine Hcl     REACTION: Rash    HOME MEDICATIONS: Outpatient Medications Prior to Visit  Medication Sig Dispense Refill   albuterol (VENTOLIN HFA) 108 (90 Base) MCG/ACT inhaler Inhale 2 puffs into the lungs daily as needed.     aspirin EC 81 MG tablet Take 81 mg by mouth daily.     benzonatate  (TESSALON ) 100 MG capsule Take 1 capsule (100 mg total) by mouth 2 (two) times daily as needed for cough. 30 capsule 0   ciclesonide (ALVESCO) 160 MCG/ACT inhaler Inhale 1 puff into the lungs 2 (two) times daily. Per Dr Vinie     desloratadine  (CLARINEX ) 5 MG tablet TAKE ONE TABLET BY MOUTH DAILY 30 tablet 1   fluticasone   (FLONASE ) 50 MCG/ACT nasal spray Place 1 spray into both nostrils daily. 16 g 6   fluticasone  (FLOVENT  HFA) 110 MCG/ACT inhaler Inhale 2 puffs into the lungs 2 (two) times daily. 1 Inhaler 3   hydrocortisone 2.5 % cream APPLY TO AFFECTED AREA TWICE A DAY AS NEEDED     levothyroxine  (SYNTHROID ) 50 MCG tablet TAKE 1 TABLET ONCE A DAY (IN THE MORNING) 30 TO 60 MINUTES BEFORE FOOD OR DRINK 90 tablet 3   liothyronine  (CYTOMEL ) 5 MCG tablet TAKE 1 TABLET 30 MINUTES BEFORE FOOD DRINK AND SUPPLEMENTS 90 tablet 3   SINGULAIR 10 MG tablet TAKE ONE TABLET EACH EVENING TO PREVENT COUGH OR WHEEZE. 30 tablet 3   valsartan -hydrochlorothiazide  (DIOVAN -HCT) 160-12.5 MG tablet Take 1 tablet by mouth daily. 90 tablet 3   No facility-administered medications prior to visit.    PAST MEDICAL HISTORY: Past Medical History:  Diagnosis Date   Allergy    allergic rhinitis-- Dr Vinie, allergist   Hypertension    Infertility, female    Thyroid  disease    hypothyroidism    PAST SURGICAL HISTORY: Past Surgical History:  Procedure Laterality Date   TUBAL LIGATION      FAMILY HISTORY: Family History  Problem Relation Age of Onset   Hypertension Mother    Hyperlipidemia Father    Hypertension Father    Stroke Father    Sleep apnea  Brother    Arthritis Other     SOCIAL HISTORY: Social History   Socioeconomic History   Marital status: Married    Spouse name: Not on file   Number of children: Not on file   Years of education: Not on file   Highest education level: Not on file  Occupational History   Not on file  Tobacco Use   Smoking status: Former    Current packs/day: 0.00    Types: Cigarettes    Start date: 04/07/1985    Quit date: 04/07/1990    Years since quitting: 33.0   Smokeless tobacco: Never   Tobacco comments:    light smoker  Substance and Sexual Activity   Alcohol use: No    Alcohol/week: 0.0 standard drinks of alcohol   Drug use: No   Sexual activity: Not on file  Other Topics  Concern   Not on file  Social History Narrative   Work or School: audiological scientist       Home Situation: lives with husband      Spiritual Beliefs: Christian      Lifestyle: no regular exercise; diet is not the best      Social Drivers of Corporate Investment Banker Strain: Not on file  Food Insecurity: Not on file  Transportation Needs: Not on file  Physical Activity: Not on file  Stress: Not on file  Social Connections: Unknown (08/17/2021)   Received from [facility]   Social Network    Social Network: Not on file  Intimate Partner Violence: Unknown (07/10/2021)   Received from [employer], [facility]   HITS    Physically Hurt: Not on file    Insult or Talk Down To: Not on file    Threaten Physical Harm: Not on file    Scream or Curse: Not on file      PHYSICAL EXAM Generalized: Well developed, in no acute distress   Neurological examination  Mentation: Alert oriented to time, place, history taking. Follows all commands speech and language fluent Cranial nerve II-XII: Facial symmetry noted  DIAGNOSTIC DATA (LABS, IMAGING, TESTING) - I reviewed patient records, labs, notes, testing and imaging myself where available.  Lab Results  Component Value Date   WBC 6.5 08/11/2022   HGB 14.0 08/11/2022   HCT 41.2 08/11/2022   MCV 90.2 08/11/2022   PLT 318.0 08/11/2022      Component Value Date/Time   NA 141 08/11/2022 0856   K 4.0 08/11/2022 0856   CL 102 08/11/2022 0856   CO2 28 08/11/2022 0856   GLUCOSE 116 (H) 08/11/2022 0856   BUN 8 08/11/2022 0856   CREATININE 0.76 08/11/2022 0856   CALCIUM 9.5 08/11/2022 0856   PROT 7.4 08/11/2022 0856   ALBUMIN 3.9 08/11/2022 0856   AST 41 (H) 08/11/2022 0856   ALT 41 (H) 08/11/2022 0856   ALKPHOS 96 08/11/2022 0856   BILITOT 0.5 08/11/2022 0856   Lab Results  Component Value Date   CHOL 170 08/11/2022   HDL 39.60 08/11/2022   LDLCALC 108 (H) 08/11/2022   TRIG 112.0 08/11/2022   CHOLHDL 4  08/11/2022   Lab Results  Component Value Date   HGBA1C 6.3 (A) 08/29/2022   No results found for: VITAMINB12 Lab Results  Component Value Date   TSH 6.31 (H) 08/11/2022      ASSESSMENT AND PLAN 63 y.o. year old female  has a past medical history of Allergy, Hypertension, Infertility, female, and Thyroid  disease. here  with:  OSA on CPAP  CPAP compliance excellent Residual AHI is good Encouraged patient to continue using CPAP nightly and > 4 hours each night F/U in 1 year or sooner if needed    Duwaine Russell, MSN, NP-C 04/19/2023, 8:02 AM [facility] [address] [place], [place] [address] [phone]

## 2023-04-20 ENCOUNTER — Telehealth (INDEPENDENT_AMBULATORY_CARE_PROVIDER_SITE_OTHER): Payer: BC Managed Care – PPO | Admitting: Adult Health

## 2023-04-20 DIAGNOSIS — G4733 Obstructive sleep apnea (adult) (pediatric): Secondary | ICD-10-CM | POA: Diagnosis not present

## 2023-07-29 NOTE — Telephone Encounter (Signed)
 This encounter was created in error - please disregard.

## 2023-08-05 ENCOUNTER — Other Ambulatory Visit: Payer: Self-pay | Admitting: Family Medicine

## 2023-08-05 DIAGNOSIS — E039 Hypothyroidism, unspecified: Secondary | ICD-10-CM

## 2023-08-06 ENCOUNTER — Other Ambulatory Visit: Payer: Self-pay | Admitting: Family Medicine

## 2023-08-06 DIAGNOSIS — E039 Hypothyroidism, unspecified: Secondary | ICD-10-CM

## 2023-08-07 DIAGNOSIS — G4733 Obstructive sleep apnea (adult) (pediatric): Secondary | ICD-10-CM | POA: Diagnosis not present

## 2023-08-24 DIAGNOSIS — H40023 Open angle with borderline findings, high risk, bilateral: Secondary | ICD-10-CM | POA: Diagnosis not present

## 2023-08-24 DIAGNOSIS — H2513 Age-related nuclear cataract, bilateral: Secondary | ICD-10-CM | POA: Diagnosis not present

## 2023-10-08 ENCOUNTER — Other Ambulatory Visit: Payer: Self-pay | Admitting: Family Medicine

## 2023-10-08 DIAGNOSIS — Z1231 Encounter for screening mammogram for malignant neoplasm of breast: Secondary | ICD-10-CM

## 2023-11-06 ENCOUNTER — Other Ambulatory Visit: Payer: Self-pay | Admitting: Family Medicine

## 2023-11-06 DIAGNOSIS — I10 Essential (primary) hypertension: Secondary | ICD-10-CM

## 2023-11-06 DIAGNOSIS — E039 Hypothyroidism, unspecified: Secondary | ICD-10-CM

## 2023-11-13 DIAGNOSIS — J4530 Mild persistent asthma, uncomplicated: Secondary | ICD-10-CM | POA: Diagnosis not present

## 2023-11-13 DIAGNOSIS — H1045 Other chronic allergic conjunctivitis: Secondary | ICD-10-CM | POA: Diagnosis not present

## 2023-11-13 DIAGNOSIS — J3089 Other allergic rhinitis: Secondary | ICD-10-CM | POA: Diagnosis not present

## 2023-11-13 DIAGNOSIS — J301 Allergic rhinitis due to pollen: Secondary | ICD-10-CM | POA: Diagnosis not present

## 2023-11-18 ENCOUNTER — Ambulatory Visit

## 2023-11-20 ENCOUNTER — Ambulatory Visit (INDEPENDENT_AMBULATORY_CARE_PROVIDER_SITE_OTHER): Admitting: Family Medicine

## 2023-11-20 ENCOUNTER — Encounter: Payer: Self-pay | Admitting: Family Medicine

## 2023-11-20 VITALS — BP 132/74 | HR 97 | Temp 98.1°F | Ht 67.0 in | Wt 239.8 lb

## 2023-11-20 DIAGNOSIS — Z0000 Encounter for general adult medical examination without abnormal findings: Secondary | ICD-10-CM | POA: Diagnosis not present

## 2023-11-20 DIAGNOSIS — E039 Hypothyroidism, unspecified: Secondary | ICD-10-CM | POA: Diagnosis not present

## 2023-11-20 DIAGNOSIS — I10 Essential (primary) hypertension: Secondary | ICD-10-CM

## 2023-11-20 NOTE — Progress Notes (Signed)
 Established Patient Office Visit   Subjective  Patient ID: Brianna [name], female    DOB: December 05, 1960  Age: 63 y.o. MRN: [id number]  Chief Complaint  Patient presents with   Annual Exam    Pt is a 63 year old female seen for CPE.  Pt is not fasting.  Patient hesitant to ask questions as does not want to be charged for an additional visit.  Patient inquires about DM diagnosis seen in chart.  Doing well overall.  Needs refills on medications.  Mentions having recent GI illness after eating at a restaurant.  [place].    Patient Active Problem List   Diagnosis Date Noted   Hyperlipidemia 11/25/2019   Hypertension 08/03/2012   Hypothyroidism 06/21/2009   Obesity 06/21/2009   Allergic rhinitis 06/21/2009   Past Medical History:  Diagnosis Date   Allergy    allergic rhinitis-- Dr Vinie, allergist   Hypertension    Infertility, female    Thyroid disease    hypothyroidism   Past Surgical History:  Procedure Laterality Date   TUBAL LIGATION     Social History   Tobacco Use   Smoking status: Former    Current packs/day: 0.00    Types: Cigarettes    Start date: 04/07/1985    Quit date: 04/07/1990    Years since quitting: 33.6   Smokeless tobacco: Never   Tobacco comments:    light smoker  Substance Use Topics   Alcohol use: No    Alcohol/week: 0.0 standard drinks of alcohol   Drug use: No   Family History  Problem Relation Age of Onset   Hypertension Mother    Hyperlipidemia Father    Hypertension Father    Stroke Father    Sleep apnea Brother    Arthritis Other    Allergies  Allergen Reactions   Ciprofloxacin Other (See Comments)    Muscle pain   Penicillins     REACTION: Rash   Procaine Hcl     REACTION: Rash    ROS Negative unless stated above    Objective:     BP 132/74 (BP Location: Left Arm, Patient Position: Sitting, Cuff Size: Large)   Pulse 97   Temp 98.1 F (36.7 C) (Oral)    Ht 5' 7 (1.702 m)   Wt 239 lb 12.8 oz (108.8 kg)   LMP  (LMP Unknown)   SpO2 98%   BMI 37.56 kg/m  BP Readings from Last 3 Encounters:  11/20/23 132/74  01/29/23 (!) 142/84  08/29/22 124/64   Wt Readings from Last 3 Encounters:  11/20/23 239 lb 12.8 oz (108.8 kg)  01/29/23 243 lb 12.8 oz (110.6 kg)  08/29/22 244 lb 3.2 oz (110.8 kg)      Physical Exam Constitutional:      Appearance: Normal appearance.  HENT:     Head: Normocephalic and atraumatic.     Right Ear: Tympanic membrane, ear canal and external ear normal.     Left Ear: Tympanic membrane, ear canal and external ear normal.     Nose: Nose normal.     Mouth/Throat:     Mouth: Mucous membranes are moist.     Pharynx: No oropharyngeal exudate or posterior oropharyngeal erythema.  Eyes:     General: No scleral icterus.    Extraocular Movements: Extraocular movements intact.     Conjunctiva/sclera: Conjunctivae normal.     Pupils: Pupils are equal, round, and reactive to light.  Neck:     Thyroid: No thyromegaly.  Cardiovascular:     Rate and Rhythm: Normal rate and regular rhythm.     Pulses: Normal pulses.     Heart sounds: Normal heart sounds. No murmur heard.    No friction rub.  Pulmonary:     Effort: Pulmonary effort is normal.     Breath sounds: Normal breath sounds. No wheezing, rhonchi or rales.  Abdominal:     General: Bowel sounds are normal.     Palpations: Abdomen is soft.     Tenderness: There is no abdominal tenderness.  Musculoskeletal:        General: No deformity. Normal range of motion.  Lymphadenopathy:     Cervical: No cervical adenopathy.  Skin:    General: Skin is warm and dry.     Findings: No lesion.  Neurological:     General: No focal deficit present.     Mental Status: She is alert and oriented to person, place, and time.  Psychiatric:        Mood and Affect: Mood normal.        Thought Content: Thought content normal.        08/11/2022    8:16 AM 04/19/2021    8:18 AM  11/18/2018   11:40 AM  Depression screen PHQ 2/9  Decreased Interest 0 0 0  Down, Depressed, Hopeless 0 0 0  PHQ - 2 Score 0 0 0  Altered sleeping 0 0   Tired, decreased energy 0 0   Change in appetite 0 0   Feeling bad or failure about yourself  0 0   Trouble concentrating 0 0   Moving slowly or fidgety/restless 0 0   Suicidal thoughts 0 0   PHQ-9 Score 0 0       08/11/2022    8:16 AM  GAD 7 : Generalized Anxiety Score  Nervous, Anxious, on Edge 0  Control/stop worrying 0  Worry too much - different things 0  Trouble relaxing 0  Restless 0  Easily annoyed or irritable 0  Afraid - awful might happen 0  Total GAD 7 Score 0     No results found for any visits on 11/20/23.    Assessment & Plan:   Well adult exam -     CBC with Differential/Platelet; Future -     Comprehensive metabolic panel with GFR; Future -     Hemoglobin A1c; Future -     Lipid panel; Future -     TSH; Future  Hypothyroidism, unspecified type -     TSH; Future  Essential hypertension -     Comprehensive metabolic panel with GFR; Future  Age-appropriate health screenings discussed.  Obtain labs.  Immunizations reviewed.  Consider flu influenza vaccine when available and pneumonia vaccine and shingles.  Chart reviewed.  It appears patient had an elevated hemoglobin A1c of 6.7% on 08/11/2022.  Subsequent A1c's including a repeat on 08/29/2022 (6.3%) have been in the prediabetic range.  Given this and as pt has not required medication dx removed.  Monitor closely.  Lifestyle modifications advised.  Return if symptoms worsen or fail to improve.   Brianna JONELLE Single, MD

## 2023-11-21 LAB — CBC WITH DIFFERENTIAL/PLATELET
Absolute Lymphocytes: 2281 {cells}/uL (ref 850–3900)
Absolute Monocytes: 787 {cells}/uL (ref 200–950)
Basophils Absolute: 31 {cells}/uL (ref 0–200)
Basophils Relative: 0.5 %
Eosinophils Absolute: 140 {cells}/uL (ref 15–500)
Eosinophils Relative: 2.3 %
HCT: 41.9 % (ref 35.0–45.0)
Hemoglobin: 13.9 g/dL (ref 11.7–15.5)
MCH: 30.4 pg (ref 27.0–33.0)
MCHC: 33.2 g/dL (ref 32.0–36.0)
MCV: 91.7 fL (ref 80.0–100.0)
MPV: 10.1 fL (ref 7.5–12.5)
Monocytes Relative: 12.9 %
Neutro Abs: 2861 {cells}/uL (ref 1500–7800)
Neutrophils Relative %: 46.9 %
Platelets: 344 Thousand/uL (ref 140–400)
RBC: 4.57 Million/uL (ref 3.80–5.10)
RDW: 12.3 % (ref 11.0–15.0)
Total Lymphocyte: 37.4 %
WBC: 6.1 Thousand/uL (ref 3.8–10.8)

## 2023-11-21 LAB — COMPREHENSIVE METABOLIC PANEL WITH GFR
AG Ratio: 1.2 (calc) (ref 1.0–2.5)
ALT: 36 U/L — ABNORMAL HIGH (ref 6–29)
AST: 39 U/L — ABNORMAL HIGH (ref 10–35)
Albumin: 4.2 g/dL (ref 3.6–5.1)
Alkaline phosphatase (APISO): 101 U/L (ref 37–153)
BUN: 10 mg/dL (ref 7–25)
CO2: 29 mmol/L (ref 20–32)
Calcium: 9.9 mg/dL (ref 8.6–10.4)
Chloride: 103 mmol/L (ref 98–110)
Creat: 0.7 mg/dL (ref 0.50–1.05)
Globulin: 3.5 g/dL (ref 1.9–3.7)
Glucose, Bld: 85 mg/dL (ref 65–99)
Potassium: 4.2 mmol/L (ref 3.5–5.3)
Sodium: 141 mmol/L (ref 135–146)
Total Bilirubin: 0.4 mg/dL (ref 0.2–1.2)
Total Protein: 7.7 g/dL (ref 6.1–8.1)
eGFR: 99 mL/min/1.73m2 (ref >=60)

## 2023-11-21 LAB — HEMOGLOBIN A1C
Hgb A1c MFr Bld: 7.4 % — ABNORMAL HIGH (ref <5.7)
Mean Plasma Glucose: 166 mg/dL
eAG (mmol/L): 9.2 mmol/L

## 2023-11-21 LAB — LIPID PANEL
Cholesterol: 171 mg/dL (ref <200)
HDL: 39 mg/dL — ABNORMAL LOW (ref >=50)
LDL Cholesterol (Calc): 104 mg/dL — ABNORMAL HIGH
Non-HDL Cholesterol (Calc): 132 mg/dL — ABNORMAL HIGH (ref <130)
Total CHOL/HDL Ratio: 4.4 (calc) (ref <5.0)
Triglycerides: 167 mg/dL — ABNORMAL HIGH (ref <150)

## 2023-11-21 LAB — TSH: TSH: 3.01 m[IU]/L (ref 0.40–4.50)

## 2023-11-25 ENCOUNTER — Ambulatory Visit: Payer: Self-pay | Admitting: Family Medicine

## 2023-12-02 ENCOUNTER — Ambulatory Visit

## 2023-12-02 DIAGNOSIS — Z1231 Encounter for screening mammogram for malignant neoplasm of breast: Secondary | ICD-10-CM | POA: Diagnosis not present

## 2023-12-10 ENCOUNTER — Ambulatory Visit: Admitting: Family Medicine

## 2023-12-15 DIAGNOSIS — Z6837 Body mass index (BMI) 37.0-37.9, adult: Secondary | ICD-10-CM | POA: Diagnosis not present

## 2023-12-15 DIAGNOSIS — Z01419 Encounter for gynecological examination (general) (routine) without abnormal findings: Secondary | ICD-10-CM | POA: Diagnosis not present

## 2024-02-05 ENCOUNTER — Other Ambulatory Visit: Payer: Self-pay | Admitting: Family Medicine

## 2024-02-05 DIAGNOSIS — I10 Essential (primary) hypertension: Secondary | ICD-10-CM

## 2024-02-05 DIAGNOSIS — E039 Hypothyroidism, unspecified: Secondary | ICD-10-CM

## 2024-02-12 ENCOUNTER — Ambulatory Visit: Admitting: Family Medicine

## 2024-02-12 ENCOUNTER — Encounter: Payer: Self-pay | Admitting: Family Medicine

## 2024-02-12 VITALS — BP 134/80 | HR 87 | Temp 98.6°F | Ht 67.0 in | Wt 239.8 lb

## 2024-02-12 DIAGNOSIS — E119 Type 2 diabetes mellitus without complications: Secondary | ICD-10-CM

## 2024-02-12 DIAGNOSIS — E66812 Obesity, class 2: Secondary | ICD-10-CM

## 2024-02-12 DIAGNOSIS — I10 Essential (primary) hypertension: Secondary | ICD-10-CM

## 2024-02-12 DIAGNOSIS — E785 Hyperlipidemia, unspecified: Secondary | ICD-10-CM | POA: Diagnosis not present

## 2024-02-12 DIAGNOSIS — Z6839 Body mass index (BMI) 39.0-39.9, adult: Secondary | ICD-10-CM

## 2024-02-12 DIAGNOSIS — Z6837 Body mass index (BMI) 37.0-37.9, adult: Secondary | ICD-10-CM

## 2024-02-12 DIAGNOSIS — E039 Hypothyroidism, unspecified: Secondary | ICD-10-CM

## 2024-02-12 DIAGNOSIS — R7303 Prediabetes: Secondary | ICD-10-CM

## 2024-02-12 LAB — COMPREHENSIVE METABOLIC PANEL WITH GFR
ALT: 49 U/L — ABNORMAL HIGH (ref 0–35)
AST: 46 U/L — ABNORMAL HIGH (ref 0–37)
Albumin: 4.2 g/dL (ref 3.5–5.2)
Alkaline Phosphatase: 108 U/L (ref 39–117)
BUN: 9 mg/dL (ref 6–23)
CO2: 30 meq/L (ref 19–32)
Calcium: 9.7 mg/dL (ref 8.4–10.5)
Chloride: 101 meq/L (ref 96–112)
Creatinine, Ser: 0.75 mg/dL (ref 0.40–1.20)
GFR: 86.1 mL/min (ref >60.00)
Glucose, Bld: 106 mg/dL — ABNORMAL HIGH (ref 70–99)
Potassium: 4.4 meq/L (ref 3.5–5.1)
Sodium: 141 meq/L (ref 135–145)
Total Bilirubin: 0.5 mg/dL (ref 0.2–1.2)
Total Protein: 7.5 g/dL (ref 6.0–8.3)

## 2024-02-12 LAB — LIPID PANEL
Cholesterol: 166 mg/dL (ref 0–200)
HDL: 39.9 mg/dL (ref >39.00)
LDL Cholesterol: 105 mg/dL — ABNORMAL HIGH (ref 0–99)
NonHDL: 125.94
Total CHOL/HDL Ratio: 4
Triglycerides: 103 mg/dL (ref 0.0–149.0)
VLDL: 20.6 mg/dL (ref 0.0–40.0)

## 2024-02-12 LAB — MICROALBUMIN / CREATININE URINE RATIO
Creatinine,U: 82.2 mg/dL
Microalb Creat Ratio: UNDETERMINED mg/g (ref 0.0–30.0)
Microalb, Ur: <0.7 mg/dL

## 2024-02-12 LAB — HEMOGLOBIN A1C: Hgb A1c MFr Bld: 7.6 % — ABNORMAL HIGH (ref 4.6–6.5)

## 2024-02-12 NOTE — Progress Notes (Signed)
 Established Patient Office Visit   Subjective  Patient ID: Brianna [name], female    DOB: 1960-10-07  Age: 63 y.o. MRN: [id number]  Chief Complaint  Patient presents with   Medical Management of Chronic Issues    Lab results follow-up, patient would like to redo labs     Pt is a 63 yo female seen for lab recheck.  Cholesterol, Hgb A1C, and LFTs mildly elevated on 11/20/23.  Pt hasn't made significant diet changes yet, but plans to get on track.  Walking a few miles during the wk for exercise.  Wants to get wt down.      Patient Active Problem List   Diagnosis Date Noted   Hyperlipidemia 11/25/2019   Hypertension 08/03/2012   Hypothyroidism 06/21/2009   Obesity 06/21/2009   Allergic rhinitis 06/21/2009   Past Medical History:  Diagnosis Date   Allergy    allergic rhinitis-- Dr Vinie, allergist   Hypertension    Infertility, female    Thyroid  disease    hypothyroidism   Past Surgical History:  Procedure Laterality Date   TUBAL LIGATION     Social History   Tobacco Use   Smoking status: Former    Current packs/day: 0.00    Types: Cigarettes    Start date: 04/07/1985    Quit date: 04/07/1990    Years since quitting: 33.8   Smokeless tobacco: Never   Tobacco comments:    light smoker  Substance Use Topics   Alcohol use: No    Alcohol/week: 0.0 standard drinks of alcohol   Drug use: No   Family History  Problem Relation Age of Onset   Hypertension Mother    Hyperlipidemia Father    Hypertension Father    Stroke Father    Sleep apnea Brother    Arthritis Other    Allergies  Allergen Reactions   Ciprofloxacin  Other (See Comments)    Muscle pain   Penicillins     REACTION: Rash   Procaine Hcl     REACTION: Rash    ROS Negative unless stated above    Objective:     BP 134/80 (BP Location: Left Arm, Patient Position: Sitting, Cuff Size: Large)   Pulse 87   Temp 98.6 F (37 C) (Oral)   Ht 5' 7 (1.702 m)   Wt 239 lb 12.8 oz (108.8 kg)   LMP   (LMP Unknown)   SpO2 98%   BMI 37.56 kg/m  BP Readings from Last 3 Encounters:  02/12/24 134/80  11/20/23 132/74  01/29/23 (!) 142/84   Wt Readings from Last 3 Encounters:  02/12/24 239 lb 12.8 oz (108.8 kg)  11/20/23 239 lb 12.8 oz (108.8 kg)  01/29/23 243 lb 12.8 oz (110.6 kg)      Physical Exam Constitutional:      General: She is not in acute distress.    Appearance: Normal appearance.  HENT:     Head: Normocephalic and atraumatic.     Nose: Nose normal.     Mouth/Throat:     Mouth: Mucous membranes are moist.  Cardiovascular:     Rate and Rhythm: Normal rate and regular rhythm.     Heart sounds: Normal heart sounds. No murmur heard.    No gallop.  Pulmonary:     Effort: Pulmonary effort is normal. No respiratory distress.     Breath sounds: Normal breath sounds. No wheezing, rhonchi or rales.  Skin:    General: Skin is warm and dry.  Neurological:  Mental Status: She is alert and oriented to person, place, and time.        08/11/2022    8:16 AM 04/19/2021    8:18 AM 11/18/2018   11:40 AM  Depression screen PHQ 2/9  Decreased Interest 0 0 0  Down, Depressed, Hopeless 0 0 0  PHQ - 2 Score 0 0 0  Altered sleeping 0 0   Tired, decreased energy 0 0   Change in appetite 0 0   Feeling bad or failure about yourself  0 0   Trouble concentrating 0 0   Moving slowly or fidgety/restless 0 0   Suicidal thoughts 0 0   PHQ-9 Score 0  0       Data saved with a previous flowsheet row definition      08/11/2022    8:16 AM  GAD 7 : Generalized Anxiety Score  Nervous, Anxious, on Edge 0  Control/stop worrying 0  Worry too much - different things 0  Trouble relaxing 0  Restless 0  Easily annoyed or irritable 0  Afraid - awful might happen 0  Total GAD 7 Score 0     No results found for any visits on 02/12/24.    Assessment & Plan:   Type 2 diabetes mellitus without complication, without long-term current use of insulin (HCC) -     Microalbumin / creatinine  urine ratio -     Hemoglobin A1c; Future  Hyperlipidemia, unspecified hyperlipidemia type -     Comprehensive metabolic panel with GFR; Future -     Lipid panel; Future  Class 2 severe obesity with serious comorbidity and body mass index (BMI) of 37.0 to 37.9 in adult, unspecified obesity type  Essential hypertension -     Comprehensive metabolic panel with GFR; Future  Hgb A1C 7.4% 11/20/2023.  Recheck A1c this visit.  If over 7% start medication.  Would like to avoid GLP-1's if medication needed.  Obtain microalbumin creatinine ratio.  [facility] exam done at [facility].  Foot exam done 11/20/2023.  Body mass index is 37.56 kg/m.  Patient congratulated on her efforts to exercise.  Continue making changes to lose weight.    Recheck cholesterol.  Triglycerides 167, total cholesterol 171, HDL 39, LDL 104 on 11/20/2023.  Discussed the importance of reading labels and other diet changes.  For continued elevation consider medication options.  Blood pressure controlled.  Continue current medications including valsartan -hydrochlorothiazide  160-12.5 mg daily.  Return in about 4 months (around 06/11/2024), or if symptoms worsen or fail to improve.   Clotilda JONELLE Single, MD

## 2024-02-24 ENCOUNTER — Ambulatory Visit: Payer: Self-pay | Admitting: Family Medicine

## 2024-02-24 DIAGNOSIS — E119 Type 2 diabetes mellitus without complications: Secondary | ICD-10-CM

## 2024-02-24 DIAGNOSIS — R7989 Other specified abnormal findings of blood chemistry: Secondary | ICD-10-CM

## 2024-02-24 MED ORDER — DAPAGLIFLOZIN PROPANEDIOL 5 MG PO TABS: 5.0000 mg | ORAL_TABLET | Freq: Every day | ORAL | 3 refills | Status: AC

## 2024-03-02 ENCOUNTER — Ambulatory Visit
Admission: RE | Admit: 2024-03-02 | Discharge: 2024-03-02 | Disposition: A | Source: Ambulatory Visit | Attending: Family Medicine

## 2024-03-02 DIAGNOSIS — R7989 Other specified abnormal findings of blood chemistry: Secondary | ICD-10-CM

## 2024-03-02 DIAGNOSIS — K760 Fatty (change of) liver, not elsewhere classified: Secondary | ICD-10-CM | POA: Diagnosis not present

## 2024-03-11 ENCOUNTER — Ambulatory Visit: Payer: Self-pay | Admitting: Family Medicine

## 2024-04-18 NOTE — Progress Notes (Unsigned)
 SABRA

## 2024-04-19 ENCOUNTER — Ambulatory Visit: Payer: BC Managed Care – PPO | Admitting: Adult Health

## 2024-04-19 ENCOUNTER — Encounter: Payer: Self-pay | Admitting: Adult Health

## 2024-04-19 VITALS — BP 115/67 | HR 76 | Ht 67.0 in | Wt 233.4 lb

## 2024-04-19 DIAGNOSIS — G4733 Obstructive sleep apnea (adult) (pediatric): Secondary | ICD-10-CM

## 2024-04-19 NOTE — Patient Instructions (Signed)
 Continue using CPAP nightly and greater than 4 hours each night If your symptoms worsen or you develop new symptoms please let us  know.

## 2024-04-19 NOTE — Progress Notes (Signed)
 "   PATIENT: Brianna [name] DOB: 03-04-1961  REASON FOR VISIT: follow up HISTORY FROM: patient PRIMARY NEUROLOGIST: Dr. Buck  Chief Complaint  Patient presents with   RM 4     Patient is here for a CPAP follow-up - no issues with mask or machine. No concerns  ESS - 5      HISTORY OF PRESENT ILLNESS: Today 04/19/2024:  Brianna [name] is a 64 y.o. female with a history of obstructive sleep apnea on CPAP. Returns today for follow-up.  She reports that CPAP is working well for her.  She continues to find it beneficial.  Denies any new medical issues.  Currently wearing the fullface mask.  Her download is below.      REVIEW OF SYSTEMS: Out of a complete 14 system review of symptoms, the patient complains only of the following symptoms, and all other reviewed systems are negative.  FSS ESS  ALLERGIES: Allergies[1]  HOME MEDICATIONS: Outpatient Medications Prior to Visit  Medication Sig Dispense Refill   albuterol (VENTOLIN HFA) 108 (90 Base) MCG/ACT inhaler Inhale 2 puffs into the lungs daily as needed.     aspirin EC 81 MG tablet Take 81 mg by mouth daily.     dapagliflozin  propanediol (FARXIGA ) 5 MG TABS tablet Take 1 tablet (5 mg total) by mouth daily. 90 tablet 3   fluticasone  (FLONASE ) 50 MCG/ACT nasal spray Place 1 spray into both nostrils daily. 16 g 6   fluticasone  (FLOVENT  HFA) 110 MCG/ACT inhaler Inhale 2 puffs into the lungs 2 (two) times daily. 1 Inhaler 3   hydrocortisone 2.5 % cream APPLY TO AFFECTED AREA TWICE A DAY AS NEEDED     levothyroxine  (SYNTHROID ) 50 MCG tablet TAKE 1 TABLET BY MOUTH EVERY MORNING 30-60 MINUTES BEFORE FOOD OR DRINK 90 tablet 3   liothyronine  (CYTOMEL ) 5 MCG tablet TAKE 1 TABLET BY MOUTH EVERY DAY 30 MINUTES BEFORE FOOD, DRINK, AND SUPPLEMENTS 90 tablet 0   Magnesium Bisglycinate (MAG GLYCINATE PO) Take by mouth.     Multiple Vitamin (MULTIVITAMIN ADULT PO)      SINGULAIR 10 MG tablet TAKE ONE TABLET EACH EVENING TO PREVENT COUGH OR  WHEEZE. 30 tablet 3   valsartan -hydrochlorothiazide  (DIOVAN -HCT) 160-12.5 MG tablet TAKE 1 TABLET BY MOUTH DAILY 90 tablet 0   No facility-administered medications prior to visit.    PAST MEDICAL HISTORY: Past Medical History:  Diagnosis Date   Allergy    allergic rhinitis-- Dr Vinie, allergist   Hypertension    Infertility, female    Thyroid  disease    hypothyroidism    PAST SURGICAL HISTORY: Past Surgical History:  Procedure Laterality Date   TUBAL LIGATION      FAMILY HISTORY: Family History  Problem Relation Age of Onset   Hypertension Mother    Hyperlipidemia Father    Hypertension Father    Stroke Father    Sleep apnea Brother    Sleep apnea Other    Arthritis Other    Migraines Neg Hx    Seizures Neg Hx     SOCIAL HISTORY: Social History   Socioeconomic History   Marital status: Married    Spouse name: Not on file   Number of children: Not on file   Years of education: Not on file   Highest education level: Not on file  Occupational History   Not on file  Tobacco Use   Smoking status: Former    Current packs/day: 0.00    Types: Cigarettes  Start date: 04/07/1985    Quit date: 04/07/1990    Years since quitting: 34.0   Smokeless tobacco: Never   Tobacco comments:    light smoker  Substance and Sexual Activity   Alcohol use: No    Alcohol/week: 0.0 standard drinks of alcohol   Drug use: No   Sexual activity: Not on file  Other Topics Concern   Not on file  Social History Narrative   Work or School: audiological scientist       Home Situation: lives with husband      Spiritual Beliefs: Christian      Lifestyle: no regular exercise; diet is not the best      Coffee 1 mug daily    Social Drivers of Health   Tobacco Use: Medium Risk (02/12/2024)   Patient History    Smoking Tobacco Use: Former    Smokeless Tobacco Use: Never    Passive Exposure: Not on file  Financial Resource Strain: Patient Declined (11/19/2023)   Overall Financial Resource  Strain (CARDIA)    Difficulty of Paying Living Expenses: Patient declined  Food Insecurity: Patient Declined (11/19/2023)   Epic    Worried About Programme Researcher, Broadcasting/film/video in the Last Year: Patient declined    Barista in the Last Year: Patient declined  Transportation Needs: Patient Declined (11/19/2023)   Epic    Lack of Transportation (Medical): Patient declined    Lack of Transportation (Non-Medical): Patient declined  Physical Activity: Unknown (11/19/2023)   Exercise Vital Sign    Days of Exercise per Week: Patient declined    Minutes of Exercise per Session: Not on file  Stress: Patient Declined (11/19/2023)   [employer] of Occupational Health - Occupational Stress Questionnaire    Feeling of Stress: Patient declined  Social Connections: Unknown (11/19/2023)   Social Connection and Isolation Panel    Frequency of Communication with Friends and Family: Patient declined    Frequency of Social Gatherings with Friends and Family: Patient declined    Attends Religious Services: Patient declined    Database Administrator or Organizations: Patient declined    Attends Banker Meetings: Not on file    Marital Status: Patient declined  Intimate Partner Violence: Unknown (07/10/2021)   Received from [facility]   HITS    Physically Hurt: Not on file    Insult or Talk Down To: Not on file    Threaten Physical Harm: Not on file    Scream or Curse: Not on file  Depression (PHQ2-9): Low Risk (08/11/2022)   Depression (PHQ2-9)    PHQ-2 Score: 0  Alcohol Screen: Not on file  Housing: Patient Declined (11/19/2023)   Epic    Unable to Pay for Housing in the Last Year: Patient declined    Number of Times Moved in the Last Year: Not on file    Homeless in the Last Year: Patient declined  Utilities: Not on file  Health Literacy: Not on file      PHYSICAL EXAM  Vitals:   04/19/24 0809  BP: 115/67  Pulse: 76  Weight: 233 lb 6.4 oz (105.9 kg)  Height: 5' 7 (1.702 m)    Body mass index is 36.56 kg/m.  Generalized: Well developed, in no acute distress  Chest: Lungs clear to auscultation bilaterally  Neurological examination  Mentation: Alert oriented to time, place, history taking. Follows all commands speech and language fluent Cranial nerve II-XII: Facial symmetry noted    DIAGNOSTIC DATA (LABS, IMAGING,  TESTING) - I reviewed patient records, labs, notes, testing and imaging myself where available.  Lab Results  Component Value Date   WBC 6.1 11/20/2023   HGB 13.9 11/20/2023   HCT 41.9 11/20/2023   MCV 91.7 11/20/2023   PLT 344 11/20/2023      Component Value Date/Time   NA 141 02/12/2024 0912   K 4.4 02/12/2024 0912   CL 101 02/12/2024 0912   CO2 30 02/12/2024 0912   GLUCOSE 106 (H) 02/12/2024 0912   BUN 9 02/12/2024 0912   CREATININE 0.75 02/12/2024 0912   CREATININE 0.70 11/20/2023 1355   CALCIUM 9.7 02/12/2024 0912   PROT 7.5 02/12/2024 0912   ALBUMIN 4.2 02/12/2024 0912   AST 46 (H) 02/12/2024 0912   ALT 49 (H) 02/12/2024 0912   ALKPHOS 108 02/12/2024 0912   BILITOT 0.5 02/12/2024 0912   Lab Results  Component Value Date   CHOL 166 02/12/2024   HDL 39.90 02/12/2024   LDLCALC 105 (H) 02/12/2024   TRIG 103.0 02/12/2024   CHOLHDL 4 02/12/2024   Lab Results  Component Value Date   HGBA1C 7.6 (H) 02/12/2024   No results found for: VITAMINB12 Lab Results  Component Value Date   TSH 3.01 11/20/2023      ASSESSMENT AND PLAN 64 y.o. year old female  has a past medical history of Allergy, Hypertension, Infertility, female, and Thyroid  disease. here with:  OSA on CPAP  - CPAP compliance excellent - Good treatment of AHI  - Encourage patient to use CPAP nightly and > 4 hours each night - F/U in 1 year or sooner if needed    Duwaine Russell, MSN, NP-C 04/19/2024, 8:22 AM [facility] [address], [address] [place], [place] [address] [phone]      [1]  Allergies Allergen Reactions    Ciprofloxacin  Other (See Comments)    Muscle pain   Penicillins     REACTION: Rash   Procaine Hcl     REACTION: Rash   "

## 2024-05-06 ENCOUNTER — Other Ambulatory Visit: Payer: Self-pay | Admitting: Family Medicine

## 2024-05-06 DIAGNOSIS — I10 Essential (primary) hypertension: Secondary | ICD-10-CM

## 2024-05-06 DIAGNOSIS — E039 Hypothyroidism, unspecified: Secondary | ICD-10-CM

## 2024-05-11 ENCOUNTER — Ambulatory Visit: Admitting: Family Medicine

## 2024-06-13 ENCOUNTER — Ambulatory Visit: Admitting: Family Medicine

## 2025-04-26 ENCOUNTER — Ambulatory Visit: Admitting: Adult Health
